# Patient Record
Sex: Male | Born: 1995 | Race: White | Hispanic: No | Marital: Single | Smoking: Former smoker
Health system: Southern US, Community
[De-identification: ages and names within clinical notes are randomized; demographics above are authoritative.]

## PROBLEM LIST (undated history)

## (undated) DIAGNOSIS — D649 Anemia, unspecified: Secondary | ICD-10-CM

## (undated) HISTORY — DX: Anemia, unspecified: D64.9

## (undated) HISTORY — PX: UPPER GASTROINTESTINAL ENDOSCOPY: SHX188

## (undated) HISTORY — PX: COLONOSCOPY: SHX174

---
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
## 2020-01-27 ENCOUNTER — Emergency Department (HOSPITAL_COMMUNITY)
Admission: EM | Admit: 2020-01-27 | Discharge: 2020-01-28 | Disposition: A | Discharge status: Home / Self Care | Payer: Self-pay | Attending: Emergency Medicine | Admitting: Emergency Medicine

## 2020-01-27 ENCOUNTER — Other Ambulatory Visit: Payer: Self-pay

## 2020-01-27 ENCOUNTER — Encounter (HOSPITAL_COMMUNITY): Payer: Self-pay | Admitting: Emergency Medicine

## 2020-01-27 DIAGNOSIS — R519 Headache, unspecified: Secondary | ICD-10-CM | POA: Insufficient documentation

## 2020-01-27 DIAGNOSIS — R5383 Other fatigue: Secondary | ICD-10-CM | POA: Insufficient documentation

## 2020-01-27 DIAGNOSIS — R109 Unspecified abdominal pain: Secondary | ICD-10-CM | POA: Insufficient documentation

## 2020-01-27 DIAGNOSIS — Z5321 Procedure and treatment not carried out due to patient leaving prior to being seen by health care provider: Secondary | ICD-10-CM | POA: Insufficient documentation

## 2020-01-27 LAB — CBC
HCT: 27.9 % — ABNORMAL LOW (ref 39.0–52.0)
Hemoglobin: 7.6 g/dL — ABNORMAL LOW (ref 13.0–17.0)
MCH: 19 pg — ABNORMAL LOW (ref 26.0–34.0)
MCHC: 27.2 g/dL — ABNORMAL LOW (ref 30.0–36.0)
MCV: 69.9 fL — ABNORMAL LOW (ref 80.0–100.0)
Platelets: 646 10*3/uL — ABNORMAL HIGH (ref 150–400)
RBC: 3.99 MIL/uL — ABNORMAL LOW (ref 4.22–5.81)
RDW: 17.4 % — ABNORMAL HIGH (ref 11.5–15.5)
WBC: 15.9 10*3/uL — ABNORMAL HIGH (ref 4.0–10.5)
nRBC: 0 % (ref 0.0–0.2)

## 2020-01-27 LAB — COMPREHENSIVE METABOLIC PANEL
ALT: 9 U/L (ref 0–44)
AST: 13 U/L — ABNORMAL LOW (ref 15–41)
Albumin: 3.5 g/dL (ref 3.5–5.0)
Alkaline Phosphatase: 47 U/L (ref 38–126)
Anion gap: 7 (ref 5–15)
BUN: <5 mg/dL — ABNORMAL LOW (ref 6–20)
CO2: 26 mmol/L (ref 22–32)
Calcium: 9.3 mg/dL (ref 8.9–10.3)
Chloride: 103 mmol/L (ref 98–111)
Creatinine, Ser: 0.89 mg/dL (ref 0.61–1.24)
GFR, Estimated: >60 mL/min (ref >60)
Glucose, Bld: 82 mg/dL (ref 70–99)
Potassium: 3.4 mmol/L — ABNORMAL LOW (ref 3.5–5.1)
Sodium: 136 mmol/L (ref 135–145)
Total Bilirubin: 0.7 mg/dL (ref 0.3–1.2)
Total Protein: 7.5 g/dL (ref 6.5–8.1)

## 2020-01-27 LAB — LIPASE, BLOOD: Lipase: 35 U/L (ref 11–51)

## 2020-01-27 NOTE — ED Notes (Signed)
Pt advised to stay but decided to leave

## 2020-01-27 NOTE — ED Triage Notes (Signed)
Pt complaining of  abd pain, increase fatigue and HA for the past week. Pt is AO x 4 no neuro deficit noticed.

## 2020-03-25 ENCOUNTER — Other Ambulatory Visit: Payer: Self-pay

## 2020-03-25 ENCOUNTER — Ambulatory Visit
Admission: RE | Admit: 2020-03-25 | Discharge: 2020-03-25 | Disposition: A | Discharge status: Home / Self Care | Payer: Self-pay | Source: Ambulatory Visit | Attending: Emergency Medicine | Admitting: Emergency Medicine

## 2020-03-25 VITALS — BP 100/65 | HR 111 | Temp 98.4°F | Resp 17

## 2020-03-25 DIAGNOSIS — K5900 Constipation, unspecified: Secondary | ICD-10-CM

## 2020-03-25 DIAGNOSIS — Z1152 Encounter for screening for COVID-19: Secondary | ICD-10-CM

## 2020-03-25 DIAGNOSIS — B349 Viral infection, unspecified: Secondary | ICD-10-CM

## 2020-03-25 MED ORDER — SENNOSIDES-DOCUSATE SODIUM 8.6-50 MG PO TABS: 1.0000 | ORAL_TABLET | Freq: Every day | ORAL | 0 refills | Status: DC

## 2020-03-25 MED ORDER — FLUTICASONE PROPIONATE 50 MCG/ACT NA SUSP: 2.0000 | Freq: Every day | NASAL | 0 refills | Status: DC

## 2020-03-25 MED ORDER — GLYCERIN (ADULT) 2 G RE SUPP: 1.0000 | Freq: Once | RECTAL | 0 refills | Status: DC | PRN

## 2020-03-25 NOTE — ED Provider Notes (Signed)
HPI  SUBJECTIVE:  Isaiah Carpenter is a 25 y.o. male who presents with 2 issues: First he reports constipation for the past 2 months.  He states that he used to stool normally once daily, but is now going "days" without defecating.  States that he drinks 500 mL to 1 L of water per day. reports painful defecation, occasional blood-streaked stools.  No melena, hematochezia, diarrhea, unintentional weight loss.  he has tried MiraLAX and unknown stools supplements with partial relief in symptoms.  No aggravating factors.  He reports mid/lower abdominal pain described as soreness and does "someone pushing on it" that is now constant as of today.  He does not take any medications on a regular basis.  No urinary retention.  He does not receive anal sex and denies foreign body insertion.  Family history negative for colon cancer.  Second, he reports 1 week of feeling feverish with fatigue, body aches, headaches, nasal congestion, rhinorrhea.  He reports mild sore throat which has resolved.  No loss of sense of smell or taste, cough, shortness of breath, nausea, vomiting.  No flu, COVID exposure.  He had a second dose of [employer] vaccine on January 15 of 2022.  He did not get the flu vaccine.  He has tried NyQuil cold and flu Tylenol with improvement in his symptoms.  No aggravating factors.  He took Tylenol within 6 hours of evaluation.  Past medical history negative for Crohn's, ulcerative colitis, constipation, HIV, abdominal surgeries, diabetes, hypertension.  PMD: None    History reviewed. No pertinent past medical history.  History reviewed. No pertinent surgical history.  History reviewed. No pertinent family history.  Social History   Tobacco Use  . Smoking status: Current Every Day Smoker    Packs/day: 0.50    Types: Cigarettes  . Smokeless tobacco: Never Used  Vaping Use  . Vaping Use: Never used  Substance Use Topics  . Alcohol use: Never  . Drug use: Never    No current  facility-administered medications for this encounter.  Current Outpatient Medications:  .  fluticasone (FLONASE) 50 MCG/ACT nasal spray, Place 2 sprays into both nostrils daily., Disp: 16 g, Rfl: 0 .  glycerin adult 2 g suppository, Place 1 suppository rectally once as needed (constipation)., Disp: 12 suppository, Rfl: 0 .  senna-docusate (SENOKOT-S) 8.6-50 MG tablet, Take 1 tablet by mouth daily., Disp: 10 tablet, Rfl: 0  Allergies  Allergen Reactions  . Ibuprofen      ROS  As noted in HPI.   Physical Exam  BP 100/65 (BP Location: Left Arm)   Pulse (!) 111   Temp 98.4 F (36.9 C) (Oral)   Resp 17   SpO2 98%   Constitutional: Well developed, well nourished, no acute distress Eyes:  EOMI, conjunctiva normal bilaterally HENT: Normocephalic, atraumatic,mucus membranes moist.  Mild nasal congestion.  No sinus tenderness. Respiratory: Normal inspiratory effort, lungs clear bilaterally. Cardiovascular:  regular tachycardia no murmurs rubs or gallop GI: Normal appearance, flat, soft.  Active bowel sounds.  Diffuse mild tenderness low abdomen.  No masses.  No guarding, rebound.  Negative McBurney. Rectal: Normal rectal tone.  Normal external appearance.  Hard stool in vault, but no impaction.  No blood on glove.  Patient declined chaperone skin: No rash, skin intact Musculoskeletal: no deformities Neurologic: Alert & oriented x 3, no focal neuro deficits Psychiatric: Speech and behavior appropriate   ED Course   Medications - No data to display  Orders Placed This Encounter  Procedures  .  Novel Coronavirus, NAA ([facility])    Standing Status:   Standing    Number of Occurrences:   1    Order Specific Question:   Patient immune status    Answer:   Normal    Order Specific Question:   Release to patient    Answer:   Immediate    No results found for this or any previous visit (from the past 24 hour(s)). No results found.  ED Clinical Impression  1. Constipation,  unspecified constipation type   2. Encounter for screening for COVID-19   3. Viral illness      ED Assessment/Plan  1.  Constipation.  No evidence of a surgical abdomen.  No evidence of obstruction, perforation, obstipation.  Glycerin suppositories, continue MiraLAX.  Senna/docusate, magnesium citrate if necessary.  Increase water to 2 L a day, increase fiber, decrease dairy.  Follow-up with GI if not getting better in several weeks.  2.  Flulike illness/Covid testing.  He is tachycardic, otherwise vitals are normal. Flu testing deferred as he is out of the window for intervention.  Home with supportive treatment.  Saline nasal irrigation, Flonase, Mucinex.  Continue Tylenol 1000 milligrams 3-4 times a day as needed.  Primary care list.  Will provide primary care referral.  Discussed labs, , MDM, treatment plan, and plan for follow-up with patient. Discussed sn/sx that should prompt return to the ED. patient agrees with plan.   Meds ordered this encounter  Medications  . fluticasone (FLONASE) 50 MCG/ACT nasal spray    Sig: Place 2 sprays into both nostrils daily.    Dispense:  16 g    Refill:  0  . DISCONTD: glycerin adult 2 g suppository    Sig: Place 1 suppository rectally once as needed (constipation).    Dispense:  12 suppository    Refill:  0  . DISCONTD: senna-docusate (SENOKOT-S) 8.6-50 MG tablet    Sig: Take 1 tablet by mouth daily.    Dispense:  10 tablet    Refill:  0  . glycerin adult 2 g suppository    Sig: Place 1 suppository rectally once as needed (constipation).    Dispense:  12 suppository    Refill:  0  . senna-docusate (SENOKOT-S) 8.6-50 MG tablet    Sig: Take 1 tablet by mouth daily.    Dispense:  10 tablet    Refill:  0    *This clinic note was created using Scientist, clinical (histocompatibility and immunogenetics). Therefore, there may be occasional mistakes despite careful proofreading.   ?    Domenick Gong, MD 03/26/20 [phone]

## 2020-03-25 NOTE — Discharge Instructions (Addendum)
Glycerin suppositories, continue MiraLAX.  Senna/docusate, magnesium citrate if necessary.  Increase water to 2 L a day, increase fiber, decrease dairy.  Follow-up with [facility] if not getting better in several weeks.   COVID will be back in several days. Saline nasal irrigation with a Lloyd Huger Med rinse and distilled water as often as you want, Flonase, Mucinex.  Continue Tylenol 1000 milligrams 3-4 times a day as needed.  Below is a list of primary care practices who are taking new patients for you to follow-up with.  [facility] internal medicine clinic Ground Floor - [facility], [address] [place], [place], [place] [address] [phone]  [facility] at [facility] [address] [place], [place] [address] [phone]  [facility] [address] [place], [place] [address] [phone]  [facility] [phone] [address] [place] [address]  [facility]: [address] [place] [address]  [phone]  [facility]: [address] [place] [address]   [phone]  [facility]: [address] [place] [address]  [phone]  [facility] : [address] [place] [address] [phone]  [facility]: [address] [place] [address] [phone]  [facility]: [address] [place] [address] [phone]  Dr. Oneal Grout [address] [facility] [place] [address]  [phone]  Dr. Jackie Plum, [facility]. [address] [place], [place] [address]  [phone]  Go to www.goodrx.com to look up your medications. This will give you a list of where you can find your prescriptions at  the most affordable prices. Or ask the pharmacist what the cash price is, or if they have any other discount programs available to help make your medication more affordable. This can be less expensive than what you would pay with insurance.

## 2020-03-25 NOTE — ED Triage Notes (Signed)
Patient states that about 2 months ago he began having issues with constipation and urgency with bowel movements. Pt states he feels like he has to go but cannot and has pressure. Pt also states he has tried otc stool softeners and has helped very little. Pt also complains of a sore throat, headache, and body aches x 1 week and has had a negative rapid covid test. Pt is aox4 and ambulatory.

## 2020-03-27 LAB — NOVEL CORONAVIRUS, NAA: SARS-CoV-2, NAA: NOT DETECTED

## 2020-03-27 LAB — SARS-COV-2, NAA 2 DAY TAT

## 2020-08-02 ENCOUNTER — Other Ambulatory Visit: Payer: Self-pay

## 2020-08-02 ENCOUNTER — Encounter (HOSPITAL_COMMUNITY): Payer: Self-pay | Admitting: *Deleted

## 2020-08-02 ENCOUNTER — Inpatient Hospital Stay (HOSPITAL_COMMUNITY)
Admission: EM | Admit: 2020-08-02 | Discharge: 2020-08-07 | DRG: 386 | Disposition: A | Discharge status: Home / Self Care | Payer: Self-pay | Attending: Internal Medicine | Admitting: Internal Medicine

## 2020-08-02 DIAGNOSIS — E871 Hypo-osmolality and hyponatremia: Secondary | ICD-10-CM | POA: Diagnosis present

## 2020-08-02 DIAGNOSIS — Z681 Body mass index (BMI) 19 or less, adult: Secondary | ICD-10-CM

## 2020-08-02 DIAGNOSIS — K51211 Ulcerative (chronic) proctitis with rectal bleeding: Principal | ICD-10-CM | POA: Diagnosis present

## 2020-08-02 DIAGNOSIS — F17210 Nicotine dependence, cigarettes, uncomplicated: Secondary | ICD-10-CM | POA: Diagnosis present

## 2020-08-02 DIAGNOSIS — K3189 Other diseases of stomach and duodenum: Secondary | ICD-10-CM | POA: Diagnosis present

## 2020-08-02 DIAGNOSIS — A630 Anogenital (venereal) warts: Secondary | ICD-10-CM | POA: Diagnosis present

## 2020-08-02 DIAGNOSIS — K8020 Calculus of gallbladder without cholecystitis without obstruction: Secondary | ICD-10-CM | POA: Diagnosis present

## 2020-08-02 DIAGNOSIS — K2980 Duodenitis without bleeding: Secondary | ICD-10-CM | POA: Diagnosis present

## 2020-08-02 DIAGNOSIS — D75839 Thrombocytosis, unspecified: Secondary | ICD-10-CM | POA: Diagnosis present

## 2020-08-02 DIAGNOSIS — K6289 Other specified diseases of anus and rectum: Secondary | ICD-10-CM | POA: Diagnosis present

## 2020-08-02 DIAGNOSIS — K2970 Gastritis, unspecified, without bleeding: Secondary | ICD-10-CM | POA: Diagnosis present

## 2020-08-02 DIAGNOSIS — K2210 Ulcer of esophagus without bleeding: Secondary | ICD-10-CM | POA: Diagnosis present

## 2020-08-02 DIAGNOSIS — E559 Vitamin D deficiency, unspecified: Secondary | ICD-10-CM | POA: Diagnosis present

## 2020-08-02 DIAGNOSIS — D500 Iron deficiency anemia secondary to blood loss (chronic): Secondary | ICD-10-CM | POA: Diagnosis present

## 2020-08-02 DIAGNOSIS — R634 Abnormal weight loss: Secondary | ICD-10-CM | POA: Diagnosis present

## 2020-08-02 DIAGNOSIS — D649 Anemia, unspecified: Secondary | ICD-10-CM

## 2020-08-02 DIAGNOSIS — E861 Hypovolemia: Secondary | ICD-10-CM | POA: Diagnosis present

## 2020-08-02 DIAGNOSIS — K641 Second degree hemorrhoids: Secondary | ICD-10-CM | POA: Diagnosis present

## 2020-08-02 DIAGNOSIS — B159 Hepatitis A without hepatic coma: Secondary | ICD-10-CM | POA: Diagnosis present

## 2020-08-02 DIAGNOSIS — K209 Esophagitis, unspecified without bleeding: Secondary | ICD-10-CM | POA: Diagnosis present

## 2020-08-02 DIAGNOSIS — Z20822 Contact with and (suspected) exposure to covid-19: Secondary | ICD-10-CM | POA: Diagnosis present

## 2020-08-02 DIAGNOSIS — K529 Noninfective gastroenteritis and colitis, unspecified: Secondary | ICD-10-CM | POA: Diagnosis present

## 2020-08-02 DIAGNOSIS — Z886 Allergy status to analgesic agent status: Secondary | ICD-10-CM

## 2020-08-02 DIAGNOSIS — K644 Residual hemorrhoidal skin tags: Secondary | ICD-10-CM | POA: Diagnosis present

## 2020-08-02 NOTE — ED Triage Notes (Signed)
The pt is c.o and pain for 2-3 days frquent urination and bms  no temp

## 2020-08-03 ENCOUNTER — Emergency Department (HOSPITAL_COMMUNITY): Payer: Self-pay

## 2020-08-03 DIAGNOSIS — D500 Iron deficiency anemia secondary to blood loss (chronic): Secondary | ICD-10-CM | POA: Diagnosis present

## 2020-08-03 DIAGNOSIS — K921 Melena: Secondary | ICD-10-CM

## 2020-08-03 DIAGNOSIS — K625 Hemorrhage of anus and rectum: Secondary | ICD-10-CM

## 2020-08-03 DIAGNOSIS — K6289 Other specified diseases of anus and rectum: Secondary | ICD-10-CM

## 2020-08-03 DIAGNOSIS — E871 Hypo-osmolality and hyponatremia: Secondary | ICD-10-CM | POA: Diagnosis present

## 2020-08-03 LAB — CBC WITH DIFFERENTIAL/PLATELET
Abs Immature Granulocytes: 0.05 10*3/uL (ref 0.00–0.07)
Basophils Absolute: 0 10*3/uL (ref 0.0–0.1)
Basophils Relative: 0 %
Eosinophils Absolute: 0 10*3/uL (ref 0.0–0.5)
Eosinophils Relative: 0 %
HCT: 27.3 % — ABNORMAL LOW (ref 39.0–52.0)
Hemoglobin: 7.5 g/dL — ABNORMAL LOW (ref 13.0–17.0)
Immature Granulocytes: 0 %
Lymphocytes Relative: 10 %
Lymphs Abs: 1.5 10*3/uL (ref 0.7–4.0)
MCH: 17.9 pg — ABNORMAL LOW (ref 26.0–34.0)
MCHC: 27.5 g/dL — ABNORMAL LOW (ref 30.0–36.0)
MCV: 65.3 fL — ABNORMAL LOW (ref 80.0–100.0)
Monocytes Absolute: 2 10*3/uL — ABNORMAL HIGH (ref 0.1–1.0)
Monocytes Relative: 13 %
Neutro Abs: 11.6 10*3/uL — ABNORMAL HIGH (ref 1.7–7.7)
Neutrophils Relative %: 77 %
Platelets: 747 10*3/uL — ABNORMAL HIGH (ref 150–400)
RBC: 4.18 MIL/uL — ABNORMAL LOW (ref 4.22–5.81)
RDW: 17.3 % — ABNORMAL HIGH (ref 11.5–15.5)
WBC: 15.2 10*3/uL — ABNORMAL HIGH (ref 4.0–10.5)
nRBC: 0 % (ref 0.0–0.2)

## 2020-08-03 LAB — URINALYSIS, ROUTINE W REFLEX MICROSCOPIC
Bilirubin Urine: NEGATIVE
Glucose, UA: NEGATIVE mg/dL
Hgb urine dipstick: NEGATIVE
Ketones, ur: 20 mg/dL — AB
Leukocytes,Ua: NEGATIVE
Nitrite: NEGATIVE
Protein, ur: NEGATIVE mg/dL
Specific Gravity, Urine: >1.046 — ABNORMAL HIGH (ref 1.005–1.030)
pH: 5 (ref 5.0–8.0)

## 2020-08-03 LAB — COMPREHENSIVE METABOLIC PANEL
ALT: 9 U/L (ref 0–44)
AST: 13 U/L — ABNORMAL LOW (ref 15–41)
Albumin: 2.7 g/dL — ABNORMAL LOW (ref 3.5–5.0)
Alkaline Phosphatase: 47 U/L (ref 38–126)
Anion gap: 8 (ref 5–15)
BUN: 8 mg/dL (ref 6–20)
CO2: 26 mmol/L (ref 22–32)
Calcium: 9.6 mg/dL (ref 8.9–10.3)
Chloride: 98 mmol/L (ref 98–111)
Creatinine, Ser: 1.02 mg/dL (ref 0.61–1.24)
GFR, Estimated: >60 mL/min (ref >60)
Glucose, Bld: 102 mg/dL — ABNORMAL HIGH (ref 70–99)
Potassium: 4.1 mmol/L (ref 3.5–5.1)
Sodium: 132 mmol/L — ABNORMAL LOW (ref 135–145)
Total Bilirubin: 0.4 mg/dL (ref 0.3–1.2)
Total Protein: 7.1 g/dL (ref 6.5–8.1)

## 2020-08-03 LAB — CBC
HCT: 26.3 % — ABNORMAL LOW (ref 39.0–52.0)
HCT: 23.4 % — ABNORMAL LOW (ref 39.0–52.0)
Hemoglobin: 7.3 g/dL — ABNORMAL LOW (ref 13.0–17.0)
Hemoglobin: 6.4 g/dL — CL (ref 13.0–17.0)
MCH: 18.1 pg — ABNORMAL LOW (ref 26.0–34.0)
MCH: 17.8 pg — ABNORMAL LOW (ref 26.0–34.0)
MCHC: 27.8 g/dL — ABNORMAL LOW (ref 30.0–36.0)
MCHC: 27.4 g/dL — ABNORMAL LOW (ref 30.0–36.0)
MCV: 65.1 fL — ABNORMAL LOW (ref 80.0–100.0)
MCV: 65.2 fL — ABNORMAL LOW (ref 80.0–100.0)
Platelets: 754 10*3/uL — ABNORMAL HIGH (ref 150–400)
Platelets: 627 10*3/uL — ABNORMAL HIGH (ref 150–400)
RBC: 4.04 MIL/uL — ABNORMAL LOW (ref 4.22–5.81)
RBC: 3.59 MIL/uL — ABNORMAL LOW (ref 4.22–5.81)
RDW: 17.4 % — ABNORMAL HIGH (ref 11.5–15.5)
RDW: 17.2 % — ABNORMAL HIGH (ref 11.5–15.5)
WBC: 13.2 10*3/uL — ABNORMAL HIGH (ref 4.0–10.5)
WBC: 12.5 10*3/uL — ABNORMAL HIGH (ref 4.0–10.5)
nRBC: 0 % (ref 0.0–0.2)
nRBC: 0 % (ref 0.0–0.2)

## 2020-08-03 LAB — IRON AND TIBC
Iron: 9 ug/dL — ABNORMAL LOW (ref 45–182)
Saturation Ratios: 2 % — ABNORMAL LOW (ref 17.9–39.5)
TIBC: 410 ug/dL (ref 250–450)
UIBC: 401 ug/dL

## 2020-08-03 LAB — SARS CORONAVIRUS 2 (TAT 6-24 HRS): SARS Coronavirus 2: NEGATIVE

## 2020-08-03 LAB — RETICULOCYTES
Immature Retic Fract: 24 % — ABNORMAL HIGH (ref 2.3–15.9)
RBC.: 4.17 MIL/uL — ABNORMAL LOW (ref 4.22–5.81)
Retic Count, Absolute: 70.5 10*3/uL (ref 19.0–186.0)
Retic Ct Pct: 1.7 % (ref 0.4–3.1)

## 2020-08-03 LAB — ABO/RH: ABO/RH(D): A POS

## 2020-08-03 LAB — POC OCCULT BLOOD, ED: Fecal Occult Bld: POSITIVE — AB

## 2020-08-03 LAB — FOLATE: Folate: 16.8 ng/mL (ref >5.9)

## 2020-08-03 LAB — PREPARE RBC (CROSSMATCH)

## 2020-08-03 LAB — FERRITIN: Ferritin: 10 ng/mL — ABNORMAL LOW (ref 24–336)

## 2020-08-03 LAB — LIPASE, BLOOD: Lipase: 18 U/L (ref 11–51)

## 2020-08-03 LAB — VITAMIN B12: Vitamin B-12: 343 pg/mL (ref 180–914)

## 2020-08-03 MED ORDER — ACETAMINOPHEN 325 MG PO TABS
650.0000 mg | ORAL_TABLET | Freq: Four times a day (QID) | ORAL | Status: DC | PRN
  Administered 2020-08-03: 650 mg via ORAL
  Filled 2020-08-03 (×2): qty 2

## 2020-08-03 MED ORDER — LIDOCAINE HCL (PF) 1 % IJ SOLN
1.0000 mL | Freq: Once | INTRAMUSCULAR | Status: AC
  Administered 2020-08-03: 1 mL
  Filled 2020-08-03: qty 5

## 2020-08-03 MED ORDER — AZITHROMYCIN 250 MG PO TABS
1000.0000 mg | ORAL_TABLET | Freq: Once | ORAL | Status: AC
  Administered 2020-08-03: 1000 mg via ORAL
  Filled 2020-08-03: qty 4

## 2020-08-03 MED ORDER — METOCLOPRAMIDE HCL 5 MG/ML IJ SOLN
10.0000 mg | Freq: Once | INTRAMUSCULAR | Status: AC
  Administered 2020-08-04: 10 mg via INTRAVENOUS
  Filled 2020-08-03: qty 2

## 2020-08-03 MED ORDER — CEFTRIAXONE SODIUM 500 MG IJ SOLR
500.0000 mg | Freq: Once | INTRAMUSCULAR | Status: AC
  Administered 2020-08-03: 15:00:00 500 mg via INTRAMUSCULAR
  Filled 2020-08-03: qty 500

## 2020-08-03 MED ORDER — SODIUM CHLORIDE 0.9% FLUSH
3.0000 mL | Freq: Two times a day (BID) | INTRAVENOUS | Status: DC
  Administered 2020-08-04 – 2020-08-07 (×4): 3 mL via INTRAVENOUS

## 2020-08-03 MED ORDER — PEG-KCL-NACL-NASULF-NA ASC-C 100 G PO SOLR
0.5000 | Freq: Once | ORAL | Status: AC
  Administered 2020-08-04: 100 g via ORAL
  Filled 2020-08-03: qty 1

## 2020-08-03 MED ORDER — PEG-KCL-NACL-NASULF-NA ASC-C 100 G PO SOLR
0.5000 | Freq: Once | ORAL | Status: AC
  Administered 2020-08-03: 100 g via ORAL
  Filled 2020-08-03: qty 1

## 2020-08-03 MED ORDER — IOHEXOL 300 MG/ML  SOLN
100.0000 mL | Freq: Once | INTRAMUSCULAR | Status: AC | PRN
  Administered 2020-08-03: 14:00:00 100 mL via INTRAVENOUS

## 2020-08-03 MED ORDER — SODIUM CHLORIDE 0.9 % IV BOLUS
1000.0000 mL | Freq: Once | INTRAVENOUS | Status: AC
  Administered 2020-08-03: 14:00:00 1000 mL via INTRAVENOUS

## 2020-08-03 MED ORDER — ACETAMINOPHEN 650 MG RE SUPP: 650.0000 mg | Freq: Four times a day (QID) | RECTAL | Status: DC | PRN

## 2020-08-03 MED ORDER — SODIUM CHLORIDE 0.9 % IV SOLN: 250.0000 mL | INTRAVENOUS | Status: DC | PRN

## 2020-08-03 MED ORDER — PEG-KCL-NACL-NASULF-NA ASC-C 100 G PO SOLR: 1.0000 | Freq: Once | ORAL | Status: DC

## 2020-08-03 MED ORDER — HYDROCODONE-ACETAMINOPHEN 5-325 MG PO TABS
1.0000 | ORAL_TABLET | ORAL | Status: DC | PRN
Start: 2020-08-03 — End: 2020-08-07
  Administered 2020-08-05: 2 via ORAL
  Filled 2020-08-03: qty 2

## 2020-08-03 MED ORDER — SODIUM CHLORIDE 0.9% FLUSH: 3.0000 mL | INTRAVENOUS | Status: DC | PRN

## 2020-08-03 MED ORDER — SODIUM CHLORIDE 0.9% IV SOLUTION: Freq: Once | INTRAVENOUS | Status: DC

## 2020-08-03 NOTE — ED Notes (Signed)
This RN noticed blood drawn at 2347 is not resulted yet. Called back into triage and blood drawn by Marylene Land. Marylene Land called the lab to notify them new samples would be coming down. [place] to tube station immediately to send blood to lab.

## 2020-08-03 NOTE — H&P (Signed)
History and Physical    Isaiah Carpenter VOJ:[id number] DOB: [birth year] DOA: 08/02/2020  PCP: Pcp, No Consultants:   Patient coming from:  Home - lives with his partner.   Chief Complaint: abdominal pain, rectal pain  HPI: Isaiah Carpenter is a 25 y.o. male with no known medical history who presented for 4 month history of intermittent lower abdominal pain that became progressively worse over the weekend.  It is located in lower abdomen with radiation into the bilateral lower quadrants. He rates the pain as 8 out of 10 and describes it as sharp. Originally was intermittent,  but over the weekend the pain become constant and unbearable. Also has pain in his rectum with BM. Some food  can make it worse.  He denies any diarrhea.  He has had intermittent blood in his stool over the past 4 days as well.  He does have a history of hemorrhoids.  He states he also started going to the bathroom every hour and only produce a small amount of stool.  He denies any history of colon cancer or inflammatory bowel disease in his family. He does not use NSAIDs.  He denies any ulcers in his mouth.  He is in a monogamous relationship with his male partner.  They were both tested for STDs in December of 2021 and negative.  Weight loss of 10 pounds since February. He has been very fatigued recently. Denies any shortness of breath. Denies any easy easy bruising or bleeding.     ED Course: vitals: bp: 110/75, hr: 102, afebrile, RR: 17 and oxygen: 100% RA. B of 7.3/hct of 26.3 and platelets of 754 stable compared to labs 6 months ago. CT showed systemic wall thickening involving the rectum extending to the level of hte mid sigmoid colon with some adjacent inflammatory stranding and prominent vasa rectal lymph nodes. GI consulted for proctitis/colitis. We were asked to admit for anemia and Gi plans for cscope tomorrow.   Review of Systems: As per HPI; otherwise review of systems reviewed and negative.   Ambulatory Status:   Ambulates without assistance  History reviewed. No pertinent past medical history.  History reviewed. No pertinent surgical history.  Social History   Socioeconomic History   Marital status: Single    Spouse name: Not on file   Number of children: Not on file   Years of education: Not on file   Highest education level: Not on file  Occupational History   Not on file  Tobacco Use   Smoking status: Every Day    Packs/day: 0.50    Pack years: 0.00    Types: Cigarettes   Smokeless tobacco: Never  Vaping Use   Vaping Use: Never used  Substance and Sexual Activity   Alcohol use: Never   Drug use: Never   Sexual activity: Yes  Other Topics Concern   Not on file  Social History Narrative   Not on file   Social Determinants of Health   Financial Resource Strain: Not on file  Food Insecurity: Not on file  Transportation Needs: Not on file  Physical Activity: Not on file  Stress: Not on file  Social Connections: Not on file  Intimate Partner Violence: Not on file    Allergies  Allergen Reactions   Ibuprofen Anaphylaxis and Swelling    No family history on file.  Prior to Admission medications   Medication Sig Start Date End Date Taking? Authorizing Provider  fluticasone (FLONASE) 50 MCG/ACT nasal spray Place 2 sprays into both  nostrils daily. 03/25/20   Domenick Gong, MD  glycerin adult 2 g suppository Place 1 suppository rectally once as needed (constipation). 03/25/20   Domenick Gong, MD  senna-docusate (SENOKOT-S) 8.6-50 MG tablet Take 1 tablet by mouth daily. 03/25/20   Domenick Gong, MD    Physical Exam: Vitals:   08/03/20 0739 08/03/20 1016 08/03/20 1141 08/03/20 1338  BP: 103/69 110/75  104/62  Pulse: 99 (!) 102  (!) 113  Resp: 15 17  16   Temp: 98.9 F (37.2 C) 99.4 F (37.4 C) 99.4 F (37.4 C)   TempSrc: Oral  Rectal   SpO2: 99% 100%  100%  Weight:      Height:         General:  Appears calm and comfortable and is in NAD. Pale appearing.   Eyes:  PERRL, EOMI, normal lids, iris ENT:  grossly normal hearing, lips & tongue, mmm; appropriate dentition Neck:  no LAD, masses or thyromegaly; no carotid bruits. Shotty adenopathy in cervical chains.  Cardiovascular:  tachycardic, no m/r/g. No LE edema.  Respiratory:   CTA bilaterally with no wheezes/rales/rhonchi.  Normal respiratory effort. Abdomen:  soft, TTP in bilateral lower quadrants. No rebound or guarding. Rectal exam deferred.  Back:   normal alignment, no CVAT Skin:  no rash or induration seen on limited exam. No obvious bruising.  Musculoskeletal:  grossly normal tone BUE/BLE, good ROM, no bony abnormality Lower extremity:  No LE edema.  Limited foot exam with no ulcerations.  2+ distal pulses. Psychiatric:  grossly normal mood and affect, speech fluent and appropriate, AOx3 Neurologic:  CN 2-12 grossly intact, moves all extremities in coordinated fashion, sensation intact    Radiological Exams on Admission: Independently reviewed - see discussion in A/P where applicable  CT ABDOMEN PELVIS W CONTRAST  Result Date: 08/03/2020 CLINICAL DATA:  Right lower quadrant abdominal pain, 2 months of constipation with intermittent urgency EXAM: CT ABDOMEN AND PELVIS WITH CONTRAST TECHNIQUE: Multidetector CT imaging of the abdomen and pelvis was performed using the standard protocol following bolus administration of intravenous contrast. CONTRAST:  08/05/2020 OMNIPAQUE IOHEXOL 300 MG/ML  SOLN COMPARISON:  None. FINDINGS: Lower chest: No acute abnormality. Normal size heart. No significant pericardial effusion/thickening. Hepatobiliary: No suspicious hepatic lesion. Cholelithiasis without findings of acute cholecystitis. No biliary ductal dilation. Pancreas: Within normal limits. Spleen: Within normal limits. Adrenals/Urinary Tract: Adrenal glands are unremarkable. Kidneys are normal, without renal calculi, focal lesion, or hydronephrosis. Bladder is unremarkable. Stomach/Bowel: Symmetric wall  thickening involving the rectum extending to the level of the mid sigmoid colon for instance on coronal image 54 and axial image 69. Large volume of formed stool throughout the remainder of the colon. The appendix is grossly unremarkable. The terminal ileum is unremarkable. No pathologic dilation of small bowel. Stomach is grossly unremarkable. Vascular/Lymphatic: No significant vascular findings are present. Prominent mesorectal lymph nodes measuring up to 5 mm on image 65/3, favored reactive. No pathologically enlarged abdominal or pelvic lymph nodes. Reproductive: Prostate is unremarkable. Other: No abdominopelvic ascites. Musculoskeletal: No acute or significant osseous findings. IMPRESSION: 1. Symmetric wall thickening involving the rectum extending to the level of the mid sigmoid colon with some adjacent inflammatory stranding and prominent mesorectal lymph nodes. Findings most consistent with infectious or inflammatory proctitis/colitis including ulcerative colitis. Gastroenterology consult suggested. 2. Large volume of formed stool throughout the remainder of the colon suggesting constipation. 3. Cholelithiasis without findings of acute cholecystitis. Electronically Signed   By: MD   On: 08/03/2020 14:02  EKG: none    Labs on Admission: I have personally reviewed the available labs and imaging studies at the time of the admission.  Pertinent labs:  Hgb: 6.4 Hct: 27.3 Platelets: 747 Sodium: 132 Fecal occult: positive  Iron studies: iron: 9 and %sat: 2 ferritin: 10   Assessment/Plan Principal Problem:   Proctitis -GI consulted and has seen. Plan for colonoscopy tomorrow. Suspicious for UC.  -given azithromycin and rocephin in ER for GC/C, but ordered urine cytology studies as well -liquid diet -prn pain medication   Active Problems:   Iron deficiency anemia due to chronic blood loss  -chronic blood loss as his hgb was 7.6 six months ago.  -hgb 6.4 in ER. Consented  for blood transfusion and giving him 2 units for goal >7.  -repeat cbc post transfusion with cbc in AM.  -thrombocytosis likely secondary to chronic anemia. Would follow outpatient after anemia is corrected.   Hyponatremia -likely secondary to volume depletion -repeat in AM following transfusion/PO liquids.   Vapes Declines any nicotine patches/gum.  Encouraged to stop.   Body mass index is 22.72 kg/m.     Level of care: Med-Surg DVT prophylaxis: scd   Code Status:  Full - confirmed with patient Family Communication: None present Disposition Plan:  The patient is from: home  Anticipated d/c is to: home pending colonoscopy procedure and findings as well as blood transfusion.  Consults called: GI consulted in ER.   Admission status:  inpatient    Orland Mustard MD [facility]   How to contact the [facility] Attending or Consulting provider 7A - 7P or covering provider during after hours 7P -7A, for this patient?  Check the care team in [facility] and look for a) attending/consulting [facility] provider listed and b) the [facility] team listed Log into [email or web address] and use [place]'s universal password to access. If you do not have the password, please contact the hospital operator. Locate the [facility] provider you are looking for under [facility] and page to a number that you can be directly reached. If you still have difficulty reaching the provider, please page the [facility] (Director on Call) for the Hospitalists listed on amion for assistance.   08/03/2020, 6:23 PM

## 2020-08-03 NOTE — ED Notes (Signed)
Report called to RN receiving pt. 

## 2020-08-03 NOTE — Consult Note (Signed)
[facility] Consult: 2:28 PM 08/03/2020  LOS: 0 days    Referring Provider: Carmie End in ED  Primary Care Physician:  Pcp, No Primary Gastroenterologist:  unassigned    Reason for Consultation:  colitis, anemia.     HPI: Isaiah Carpenter is a 25 y.o. male.  Unremarkable past history. Several months intermittent abdominal pain which has become constant since last Thursday.  Pain generally in the lower abdomen, 7/10 intensity.  Pain worse postprandial, some improvement if he lays down.  Seeing blood in his stool which he thought was from straining to have a bowel movement and hemorrhoids which he can feel himself.  Lately he is seeing more blood than he did at the beginning.  Frequently has urges to defecate and then produces only a small amount of stool.  Weight loss of 5 to 10 pounds since onset of symptoms in February.  Endorses fatigue.  The symptoms have been ebbing and flowing and he is actually had 7 to 10 days where he is symptom-free periodically.  Allergic to NSAIDs, Advil and naproxen them have caused anaphylaxis in the past so he does not use these.  Uses Tylenol infrequently if needed for pain management  Because of worsening and constant symptoms presented 11 PM last night at the ED but was not seen until 11 AM this morning.   Initial heart rate 139 and BPs as low as 97/68 and generally soft.  No fevers.  Hgb 7.3, MCV 65.1.  WBCs 13.2.  Platelets 754K. FOBT + Na 132.  Renal function and electrolytes otherwise normal.  Lipase, LFTs normal.  MEL labs are pending. CTAP w contrast: Thickening from the rectum to the mid sigmoid with adjacent inflammatory stranding and prominent mesorectal lymph nodes.  Findings consistent with infectious versus inflammatory colitis.  Large volume of formed stool in the  remaining colon suggesting constipation.  Uncomplicated cholelithiasis.  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  Moderate alcohol consisting of 1-2 drinks once a month.  Vapes daily.  No Fm Hx IBD, nor of autoimmune disorders.  History reviewed. No pertinent past medical history.  History reviewed. No pertinent surgical history.  Prior to Admission medications   Medication Sig Start Date End Date Taking? Authorizing Provider  fluticasone (FLONASE) 50 MCG/ACT nasal spray Place 2 sprays into both nostrils daily. 03/25/20   Melynda Ripple, MD  glycerin adult 2 g suppository Place 1 suppository rectally once as needed (constipation). 03/25/20   Melynda Ripple, MD  senna-docusate (SENOKOT-S) 8.6-50 MG tablet Take 1 tablet by mouth daily. 03/25/20   Melynda Ripple, MD    Scheduled Meds:  azithromycin  1,000 mg Oral Once   cefTRIAXone (ROCEPHIN) IM  500 mg Intramuscular Once   lidocaine (PF)  1 mL Other Once   Infusions:  PRN Meds:    Allergies as of 08/02/2020 - Review Complete 08/02/2020  Allergen Reaction Noted   Ibuprofen  01/27/2020    No family history on file.  Social History   Socioeconomic History   Marital status: Single    Spouse  name: Not on file   Number of children: Not on file   Years of education: Not on file   Highest education level: Not on file  Occupational History   Not on file  Tobacco Use   Smoking status: Every Day    Packs/day: 0.50    Pack years: 0.00    Types: Cigarettes   Smokeless tobacco: Never  Vaping Use   Vaping Use: Never used  Substance and Sexual Activity   Alcohol use: Never   Drug use: Never   Sexual activity: Yes  Other Topics Concern   Not on file  Social History Narrative   Not on file   Social Determinants of Health   Financial Resource Strain: Not on file  Food Insecurity: Not on file  Transportation Needs: Not on file  Physical Activity: Not on file  Stress: Not on file  Social Connections: Not on  file  Intimate Partner Violence: Not on file    REVIEW OF SYSTEMS: Constitutional: Weakness, fatigue. ENT:  No nose bleeds Pulm: Occasional dyspnea on exertion.  No cough.  No resting dyspnea. CV:  No palpitations, no LE edema.  No angina GU:  No hematuria, no frequency GI: See HPI. Heme: Other than the rectal bleeding, no other unusual or excessive bleeding or bruising.  No previous history of anemia. Transfusions: No previous blood product transfusions. Neuro:  No headaches, no peripheral tingling or numbness Derm:  No itching, no rash or sores.  Endocrine:  No sweats or chills.  No polyuria or dysuria Immunization: Not queried Travel:  None beyond local counties in last few months.    PHYSICAL EXAM: Vital signs in last 24 hours: Vitals:   08/03/20 1141 08/03/20 1338  BP:  104/62  Pulse:  (!) 113  Resp:  16  Temp: 99.4 F (37.4 C)   SpO2:  100%   Wt Readings from Last 3 Encounters:  08/02/20 65.8 kg  01/27/20 65.8 kg    General: Thin, pale, comfortable, does not appear acutely ill.  Alert and able to provide good history Head: No facial asymmetry or swelling.  No signs of head trauma. Eyes: Conjunctiva pale.  EOMI Ears: Not hard of hearing Nose: No congestion or discharge Mouth: Good dentition.  Tongue midline.  Mucosa pink, moist, clear. Neck: No JVD, no masses, no thyromegaly Lungs: Clear bilaterally without labored breathing. Heart: RRR.  No MRG.  S1, S2 present Abdomen: Soft.  Tenderness in the lower abdomen without guarding or rebound.  Not distended.  Bowel sounds active without high-pitched or tinkling bowel sounds.  No HSM, masses, bruits, hernias.   Rectal: Did not perform DRE.  Reported by patient as bloody. Musc/Skeltl: No joint redness, swelling or gross deformity. Extremities: No CCE Neurologic: Alert.  Oriented x3.  Moves all 4 limbs without tremor or weakness. Skin: Pale.  No suspicious lesions or rashes. Tattoos: Several tattoos on the trunk and  limbs, all professional quality. Nodes: No cervical adenopathy Psych: Cooperative, calm, pleasant.  Fluid speech.  Intake/Output from previous day: No intake/output data recorded. Intake/Output this shift: No intake/output data recorded.  LAB RESULTS: Recent Labs    08/03/20 1013 08/03/20 1418  WBC 13.2* 15.2*  HGB 7.3* 7.5*  HCT 26.3* 27.3*  PLT 754* 747*   BMET Lab Results  Component Value Date   NA 132 (L) 08/03/2020   NA 136 01/27/2020   K 4.1 08/03/2020   K 3.4 (L) 01/27/2020   CL 98 08/03/2020   CL 103 01/27/2020  CO2 26 08/03/2020   CO2 26 01/27/2020   GLUCOSE 102 (H) 08/03/2020   GLUCOSE 82 01/27/2020   BUN 8 08/03/2020   BUN <5 (L) 01/27/2020   CREATININE 1.02 08/03/2020   CREATININE 0.89 01/27/2020   CALCIUM 9.6 08/03/2020   CALCIUM 9.3 01/27/2020   LFT Recent Labs    08/03/20 1013  PROT 7.1  ALBUMIN 2.7*  AST 13*  ALT 9  ALKPHOS 47  BILITOT 0.4   PT/INR No results found for: INR, PROTIME Hepatitis Panel No results for input(s): HEPBSAG, HCVAB, HEPAIGM, HEPBIGM in the last 72 hours. C-Diff No components found for: CDIFF Lipase     Component Value Date/Time   LIPASE 18 08/03/2020 1013    Drugs of Abuse  No results found for: LABOPIA, COCAINSCRNUR, LABBENZ, AMPHETMU, THCU, LABBARB   RADIOLOGY STUDIES: CT ABDOMEN PELVIS W CONTRAST  Result Date: 08/03/2020 CLINICAL DATA:  Right lower quadrant abdominal pain, 2 months of constipation with intermittent urgency EXAM: CT ABDOMEN AND PELVIS WITH CONTRAST TECHNIQUE: Multidetector CT imaging of the abdomen and pelvis was performed using the standard protocol following bolus administration of intravenous contrast. CONTRAST:  170m OMNIPAQUE IOHEXOL 300 MG/ML  SOLN COMPARISON:  None. FINDINGS: Lower chest: No acute abnormality. Normal size heart. No significant pericardial effusion/thickening. Hepatobiliary: No suspicious hepatic lesion. Cholelithiasis without findings of acute cholecystitis. No  biliary ductal dilation. Pancreas: Within normal limits. Spleen: Within normal limits. Adrenals/Urinary Tract: Adrenal glands are unremarkable. Kidneys are normal, without renal calculi, focal lesion, or hydronephrosis. Bladder is unremarkable. Stomach/Bowel: Symmetric wall thickening involving the rectum extending to the level of the mid sigmoid colon for instance on coronal image 54 and axial image 69. Large volume of formed stool throughout the remainder of the colon. The appendix is grossly unremarkable. The terminal ileum is unremarkable. No pathologic dilation of small bowel. Stomach is grossly unremarkable. Vascular/Lymphatic: No significant vascular findings are present. Prominent mesorectal lymph nodes measuring up to 5 mm on image 65/3, favored reactive. No pathologically enlarged abdominal or pelvic lymph nodes. Reproductive: Prostate is unremarkable. Other: No abdominopelvic ascites. Musculoskeletal: No acute or significant osseous findings. IMPRESSION: 1. Symmetric wall thickening involving the rectum extending to the level of the mid sigmoid colon with some adjacent inflammatory stranding and prominent mesorectal lymph nodes. Findings most consistent with infectious or inflammatory proctitis/colitis including ulcerative colitis. Gastroenterology consult suggested. 2. Large volume of formed stool throughout the remainder of the colon suggesting constipation. 3. Cholelithiasis without findings of acute cholecystitis. Electronically Signed   By: JDahlia BailiffMD   On: 08/03/2020 14:02     IMPRESSION:      Colitis involving sigmoid to rectum..Marland Kitchen By the history and CT findings suspicious for ulcerative colitis.  Microcytic anemia in setting of bloody stools.     Uncomplicated cholelithiasis.    PLAN:       Colonoscopy, set for 230 tomorrow with moderate sedation though anesthesia may be able to provide MAC for sedation. Patient agreeable to proceed with the colonoscopy.  He was hoping it  could be done as an outpatient but I explained that it could be several weeks before we can fit him in for outpatient procedure and that if he stays overnight and we do the procedure tomorrow he will have his diagnosis much sooner and we can treat him appropriately.       Hgb/crit in AM.       ?  Initiate IV Solu-Medrol?, will d/w Dr MJerilynn Mages      Clear  liquids.     Azucena Freed  08/03/2020, 2:28 PM Phone [phone]

## 2020-08-03 NOTE — ED Provider Notes (Signed)
[facility]ENC[facility] DEPARTMENT Provider Note   CSN: [id number] Arrival date & time: 08/02/20  2200     History Chief Complaint  Patient presents with   Abdominal Pain    Isaiah Carpenter is a 25 y.o. male with no reported medical history.  Patient presents emerged department with a chief complaint of lower abdominal pain.  Patient reports that he has had this pain intermittently since February.  Patient reports that his pain has been constant since Thursday.  Pain is located throughout the entire lower abdomen.  Patient rates pain 7/10 on the pain scale.  Patient reports that pain is worse when eating.  Minimal improvement with laying down.  Patient endorses blood in stool.  States that he has been straining during bowel movements and believes he has hemorrhoids.  Patient describes blood as bright red in notices it when wiping and minimal amount in toilet bowl after bowel movement.  Patient reports frequent feelings of needing to have a bowel movement and then only producing small amounts of stool.  Patient endorses chills, nausea, night sweats, unintentional weight loss of 5 to 10 pounds since February, fatigue.  No change in stool caliber.  Patient denies any melena, vomiting, chest pain, shortness of breath, URI symptoms, dysuria, hematuria, genital sores or lesions, penile discharge, swelling or tenderness to genitals.  Patient reports that he has not been sexually active for a few months.  Patient does not perform receptive anal sex.   Abdominal Pain Associated symptoms: chills, constipation and fatigue   Associated symptoms: no chest pain, no cough, no diarrhea, no dysuria, no fever, no hematuria, no nausea, no shortness of breath, no sore throat and no vomiting       History reviewed. No pertinent past medical history.  There are no problems to display for this patient.   History reviewed. No pertinent surgical history.     No family history on  file.  Social History   Tobacco Use   Smoking status: Every Day    Packs/day: 0.50    Pack years: 0.00    Types: Cigarettes   Smokeless tobacco: Never  Vaping Use   Vaping Use: Never used  Substance Use Topics   Alcohol use: Never   Drug use: Never    Home Medications Prior to Admission medications   Medication Sig Start Date End Date Taking? Authorizing Provider  fluticasone (FLONASE) 50 MCG/ACT nasal spray Place 2 sprays into both nostrils daily. 03/25/20   Melynda Ripple, MD  glycerin adult 2 g suppository Place 1 suppository rectally once as needed (constipation). 03/25/20   Melynda Ripple, MD  senna-docusate (SENOKOT-S) 8.6-50 MG tablet Take 1 tablet by mouth daily. 03/25/20   Melynda Ripple, MD    Allergies    Ibuprofen  Review of Systems   Review of Systems  Constitutional:  Positive for chills, fatigue and unexpected weight change. Negative for fever.  HENT:  Negative for congestion, rhinorrhea and sore throat.   Eyes:  Negative for visual disturbance.  Respiratory:  Negative for cough and shortness of breath.   Cardiovascular:  Negative for chest pain.  Gastrointestinal:  Positive for abdominal pain, blood in stool, constipation and rectal pain. Negative for abdominal distention, anal bleeding, diarrhea, nausea and vomiting.  Genitourinary:  Negative for difficulty urinating, dysuria, genital sores, hematuria, penile discharge, penile pain, penile swelling, scrotal swelling and testicular pain.  Musculoskeletal:  Negative for back pain and neck pain.  Skin:  Negative for color change, pallor, rash and  wound.  Neurological:  Negative for dizziness, syncope, light-headedness and headaches.  Psychiatric/Behavioral:  Negative for confusion.    Physical Exam Updated Vital Signs BP 110/75 (BP Location: Left Arm)   Pulse (!) 102   Temp 99.4 F (37.4 C)   Resp 17   Ht _0  (1.702 m)   Wt 65.8 kg   SpO2 100%   BMI 22.72 kg/m   Physical Exam Vitals and  nursing note reviewed. Exam conducted with a chaperone present (Male RN present as chaperone).  Constitutional:      General: He is not in acute distress.    Appearance: He is not ill-appearing, toxic-appearing or diaphoretic.  HENT:     Head: Normocephalic.  Eyes:     General: No scleral icterus.       Right eye: No discharge.        Left eye: No discharge.  Cardiovascular:     Rate and Rhythm: Tachycardia present.     Comments: Tachycardia at rate of 102 Pulmonary:     Effort: Pulmonary effort is normal. No tachypnea, bradypnea or respiratory distress.     Breath sounds: Normal breath sounds. No stridor.  Abdominal:     General: Abdomen is flat. Bowel sounds are normal. There is no distension. There are no signs of injury.     Palpations: Abdomen is soft. There is no mass or pulsatile mass.     Tenderness: There is abdominal tenderness in the right lower quadrant and left lower quadrant. There is right CVA tenderness. There is no left CVA tenderness, guarding or rebound. Positive signs include McBurney's sign. Negative signs include Murphy's sign and psoas sign.     Hernia: There is no hernia in the umbilical area or ventral area.  Genitourinary:    Rectum: Guaiac result positive. Tenderness and external hemorrhoid present. No mass, anal fissure or internal hemorrhoid. Normal anal tone.     Comments: Patient had external hemorrhoids to 6, 9, and 12 o'clock position.  Tenderness to rectum.  Small amount of light brown stool noted in rectal vault, no melena or frank red blood observed. Musculoskeletal:     Cervical back: Neck supple.  Skin:    General: Skin is warm and dry.  Neurological:     General: No focal deficit present.     Mental Status: He is alert.  Psychiatric:        Behavior: Behavior is cooperative.    ED Results / Procedures / Treatments   Labs (all labs ordered are listed, but only abnormal results are displayed) Labs Reviewed  COMPREHENSIVE METABOLIC PANEL -  Abnormal; Notable for the following components:      Result Value   Sodium 132 (*)    Glucose, Bld 102 (*)    Albumin 2.7 (*)    AST 13 (*)    All other components within normal limits  CBC - Abnormal; Notable for the following components:   WBC 13.2 (*)    RBC 4.04 (*)    Hemoglobin 7.3 (*)    HCT 26.3 (*)    MCV 65.1 (*)    MCH 18.1 (*)    MCHC 27.8 (*)    RDW 17.4 (*)    Platelets 754 (*)    All other components within normal limits  POC OCCULT BLOOD, ED - Abnormal; Notable for the following components:   Fecal Occult Bld POSITIVE (*)    All other components within normal limits  LIPASE, BLOOD  URINALYSIS, ROUTINE W REFLEX  MICROSCOPIC  DIFFERENTIAL  VITAMIN B12  FOLATE  IRON AND TIBC  FERRITIN  RETICULOCYTES  TYPE AND SCREEN    EKG None  Radiology CT ABDOMEN PELVIS W CONTRAST  Result Date: 08/03/2020 CLINICAL DATA:  Right lower quadrant abdominal pain, 2 months of constipation with intermittent urgency EXAM: CT ABDOMEN AND PELVIS WITH CONTRAST TECHNIQUE: Multidetector CT imaging of the abdomen and pelvis was performed using the standard protocol following bolus administration of intravenous contrast. CONTRAST:  13m OMNIPAQUE IOHEXOL 300 MG/ML  SOLN COMPARISON:  None. FINDINGS: Lower chest: No acute abnormality. Normal size heart. No significant pericardial effusion/thickening. Hepatobiliary: No suspicious hepatic lesion. Cholelithiasis without findings of acute cholecystitis. No biliary ductal dilation. Pancreas: Within normal limits. Spleen: Within normal limits. Adrenals/Urinary Tract: Adrenal glands are unremarkable. Kidneys are normal, without renal calculi, focal lesion, or hydronephrosis. Bladder is unremarkable. Stomach/Bowel: Symmetric wall thickening involving the rectum extending to the level of the mid sigmoid colon for instance on coronal image 54 and axial image 69. Large volume of formed stool throughout the remainder of the colon. The appendix is grossly  unremarkable. The terminal ileum is unremarkable. No pathologic dilation of small bowel. Stomach is grossly unremarkable. Vascular/Lymphatic: No significant vascular findings are present. Prominent mesorectal lymph nodes measuring up to 5 mm on image 65/3, favored reactive. No pathologically enlarged abdominal or pelvic lymph nodes. Reproductive: Prostate is unremarkable. Other: No abdominopelvic ascites. Musculoskeletal: No acute or significant osseous findings. IMPRESSION: 1. Symmetric wall thickening involving the rectum extending to the level of the mid sigmoid colon with some adjacent inflammatory stranding and prominent mesorectal lymph nodes. Findings most consistent with infectious or inflammatory proctitis/colitis including ulcerative colitis. Gastroenterology consult suggested. 2. Large volume of formed stool throughout the remainder of the colon suggesting constipation. 3. Cholelithiasis without findings of acute cholecystitis. Electronically Signed   By: JDahlia BailiffMD   On: 08/03/2020 14:02    Procedures Procedures   Medications Ordered in ED Medications  peg 3350 powder (MOVIPREP) kit 200 g (has no administration in time range)  metoCLOPramide (REGLAN) injection 10 mg (has no administration in time range)    Followed by  metoCLOPramide (REGLAN) injection 10 mg (has no administration in time range)  iohexol (OMNIPAQUE) 300 MG/ML solution 100 mL (100 mLs Intravenous Contrast Given 08/03/20 1332)  sodium chloride 0.9 % bolus 1,000 mL (1,000 mLs Intravenous New Bag/Given 08/03/20 1426)  cefTRIAXone (ROCEPHIN) injection 500 mg (500 mg Intramuscular Given 08/03/20 1439)  lidocaine (PF) (XYLOCAINE) 1 % injection 1 mL (1 mL Other Given 08/03/20 1439)  azithromycin (ZITHROMAX) tablet 1,000 mg (1,000 mg Oral Given 08/03/20 1438)    ED Course  I have reviewed the triage vital signs and the nursing notes.  Pertinent labs & imaging results that were available during my care of the patient were  reviewed by me and considered in my medical decision making (see chart for details).  Clinical Course as of 08/03/20 1534  Mon Aug 03, 2020  1429 Spoke 2 on-call gastroenterologist to reports they will come to see the patient. [PB]    Clinical Course User Index [PB] BDyann Ruddle  MDM Rules/Calculators/A&P                          Alert 25year old male no acute distress, nontoxic-appearing.  Patient presents emerged part with a chief complaint of lower abdominal pain.  Patient symptoms have been intermittent since February.  Pain has been constant since Thursday.  Patient endorses blood in stool, which she described as bright red and seen with wiping and in bowl.  No melena or change in stool caliber. Patient reports frequent feelings of needing to have a bowel movement and then only producing small amounts of stool.  Patient endorses chills, nausea, night sweats, unintentional weight loss of 5 to 10 pounds since February, fatigue.  No change in stool caliber.  Physical exam patient has tenderness to lower left and right quadrant.  Abdomen soft, nondistended, no guarding or rebound tenderness.  Rectal exam shows multiple hemorrhoids, rectal tenderness.  Small amount of light brown stool noted in rectal vault, no frank red blood or melena.  Hemoccult positive.  Rectal temperature 99.4.  CBC shows leukocytosis at 13.2, anemia with hemoglobin at 7.3 and hematocrit at 26.3, thrombocytosis at 754.  Lab results are stable compared with last known labs obtained 6 months prior.  Patient denies any chest pain, shortness of breath.  Patient does endorse fatigue. Will obtain differential to evaluate for blasts or abnormal cell morphology.  Will obtain anemia panel. Differential obtained shows increased neutrophil and monocyte count.  We will consult hematology for input on additional testing to evaluate for possible malignancy.  CMP shows albumin decreased at 2.7 Lipase within normal  limits. UA shows no signs of infection.  Due to patient's lower quadrant abdominal pain will obtain CT scan of abdomen pelvis to evaluate for possible appendicitis, diverticulitis, or intra-abdominal mass. CT abdomen pelvis shows systemic wall thickening involving the rectum extending to the level of the mid sigmoid colon with some adjacent inflammatory stranding and prominent vasa rectal lymph nodes.  Findings most consistent with infectious or inflammatory proctitis/colitis including UC.  We will treat patient carefully for gonorrhea chlamydia based on age and sexual history.  Patient given ceftriaxone and azithromycin. Consulted gastroenterology due to patient's CT scan findings.   1429 spoke to on-call GI PA Azucena Freed who advised GI team will see the patient.  Per gastroenterology note patient will be taken to colonoscopy tomorrow.  We will consult hospitalist for admission.  Patient care transferred to [place] at the end of my shift. Patient presentation, ED course, and plan of care discussed with review of all pertinent labs and imaging. Please see his/her note for further details regarding further ED course and disposition.   Final Clinical Impression(s) / ED Diagnoses Final diagnoses:  Colitis  Anemia, unspecified type    Rx / DC Orders ED Discharge Orders     None        Dyann Ruddle 08/03/20 1549    Noemi Chapel, MD 08/03/20 1708

## 2020-08-03 NOTE — H&P (View-Only) (Signed)
[facility] Consult: 2:28 PM 08/03/2020  LOS: 0 days    Referring Provider: Carmie End in ED  Primary Care Physician:  Pcp, No Primary Gastroenterologist:  unassigned    Reason for Consultation:  colitis, anemia.     HPI: Isaiah Carpenter is a 25 y.o. male.  Unremarkable past history. Several months intermittent abdominal pain which has become constant since last Thursday.  Pain generally in the lower abdomen, 7/10 intensity.  Pain worse postprandial, some improvement if he lays down.  Seeing blood in his stool which he thought was from straining to have a bowel movement and hemorrhoids which he can feel himself.  Lately he is seeing more blood than he did at the beginning.  Frequently has urges to defecate and then produces only a small amount of stool.  Weight loss of 5 to 10 pounds since onset of symptoms in February.  Endorses fatigue.  The symptoms have been ebbing and flowing and he is actually had 7 to 10 days where he is symptom-free periodically.  Allergic to NSAIDs, Advil and naproxen them have caused anaphylaxis in the past so he does not use these.  Uses Tylenol infrequently if needed for pain management  Because of worsening and constant symptoms presented 11 PM last night at the ED but was not seen until 11 AM this morning.   Initial heart rate 139 and BPs as low as 97/68 and generally soft.  No fevers.  Hgb 7.3, MCV 65.1.  WBCs 13.2.  Platelets 754K. FOBT + Na 132.  Renal function and electrolytes otherwise normal.  Lipase, LFTs normal.  MEL labs are pending. CTAP w contrast: Thickening from the rectum to the mid sigmoid with adjacent inflammatory stranding and prominent mesorectal lymph nodes.  Findings consistent with infectious versus inflammatory colitis.  Large volume of formed stool in the  remaining colon suggesting constipation.  Uncomplicated cholelithiasis.  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  Moderate alcohol consisting of 1-2 drinks once a month.  Vapes daily.  No Fm Hx IBD, nor of autoimmune disorders.  History reviewed. No pertinent past medical history.  History reviewed. No pertinent surgical history.  Prior to Admission medications   Medication Sig Start Date End Date Taking? Authorizing Provider  fluticasone (FLONASE) 50 MCG/ACT nasal spray Place 2 sprays into both nostrils daily. 03/25/20   Melynda Ripple, MD  glycerin adult 2 g suppository Place 1 suppository rectally once as needed (constipation). 03/25/20   Melynda Ripple, MD  senna-docusate (SENOKOT-S) 8.6-50 MG tablet Take 1 tablet by mouth daily. 03/25/20   Melynda Ripple, MD    Scheduled Meds:  azithromycin  1,000 mg Oral Once   cefTRIAXone (ROCEPHIN) IM  500 mg Intramuscular Once   lidocaine (PF)  1 mL Other Once   Infusions:  PRN Meds:    Allergies as of 08/02/2020 - Review Complete 08/02/2020  Allergen Reaction Noted   Ibuprofen  01/27/2020    No family history on file.  Social History   Socioeconomic History   Marital status: Single    Spouse  name: Not on file   Number of children: Not on file   Years of education: Not on file   Highest education level: Not on file  Occupational History   Not on file  Tobacco Use   Smoking status: Every Day    Packs/day: 0.50    Pack years: 0.00    Types: Cigarettes   Smokeless tobacco: Never  Vaping Use   Vaping Use: Never used  Substance and Sexual Activity   Alcohol use: Never   Drug use: Never   Sexual activity: Yes  Other Topics Concern   Not on file  Social History Narrative   Not on file   Social Determinants of Health   Financial Resource Strain: Not on file  Food Insecurity: Not on file  Transportation Needs: Not on file  Physical Activity: Not on file  Stress: Not on file  Social Connections: Not on  file  Intimate Partner Violence: Not on file    REVIEW OF SYSTEMS: Constitutional: Weakness, fatigue. ENT:  No nose bleeds Pulm: Occasional dyspnea on exertion.  No cough.  No resting dyspnea. CV:  No palpitations, no LE edema.  No angina GU:  No hematuria, no frequency GI: See HPI. Heme: Other than the rectal bleeding, no other unusual or excessive bleeding or bruising.  No previous history of anemia. Transfusions: No previous blood product transfusions. Neuro:  No headaches, no peripheral tingling or numbness Derm:  No itching, no rash or sores.  Endocrine:  No sweats or chills.  No polyuria or dysuria Immunization: Not queried Travel:  None beyond local counties in last few months.    PHYSICAL EXAM: Vital signs in last 24 hours: Vitals:   08/03/20 1141 08/03/20 1338  BP:  104/62  Pulse:  (!) 113  Resp:  16  Temp: 99.4 F (37.4 C)   SpO2:  100%   Wt Readings from Last 3 Encounters:  08/02/20 65.8 kg  01/27/20 65.8 kg    General: Thin, pale, comfortable, does not appear acutely ill.  Alert and able to provide good history Head: No facial asymmetry or swelling.  No signs of head trauma. Eyes: Conjunctiva pale.  EOMI Ears: Not hard of hearing Nose: No congestion or discharge Mouth: Good dentition.  Tongue midline.  Mucosa pink, moist, clear. Neck: No JVD, no masses, no thyromegaly Lungs: Clear bilaterally without labored breathing. Heart: RRR.  No MRG.  S1, S2 present Abdomen: Soft.  Tenderness in the lower abdomen without guarding or rebound.  Not distended.  Bowel sounds active without high-pitched or tinkling bowel sounds.  No HSM, masses, bruits, hernias.   Rectal: Did not perform DRE.  Reported by patient as bloody. Musc/Skeltl: No joint redness, swelling or gross deformity. Extremities: No CCE Neurologic: Alert.  Oriented x3.  Moves all 4 limbs without tremor or weakness. Skin: Pale.  No suspicious lesions or rashes. Tattoos: Several tattoos on the trunk and  limbs, all professional quality. Nodes: No cervical adenopathy Psych: Cooperative, calm, pleasant.  Fluid speech.  Intake/Output from previous day: No intake/output data recorded. Intake/Output this shift: No intake/output data recorded.  LAB RESULTS: Recent Labs    08/03/20 1013 08/03/20 1418  WBC 13.2* 15.2*  HGB 7.3* 7.5*  HCT 26.3* 27.3*  PLT 754* 747*   BMET Lab Results  Component Value Date   NA 132 (L) 08/03/2020   NA 136 01/27/2020   K 4.1 08/03/2020   K 3.4 (L) 01/27/2020   CL 98 08/03/2020   CL 103 01/27/2020  CO2 26 08/03/2020   CO2 26 01/27/2020   GLUCOSE 102 (H) 08/03/2020   GLUCOSE 82 01/27/2020   BUN 8 08/03/2020   BUN <5 (L) 01/27/2020   CREATININE 1.02 08/03/2020   CREATININE 0.89 01/27/2020   CALCIUM 9.6 08/03/2020   CALCIUM 9.3 01/27/2020   LFT Recent Labs    08/03/20 1013  PROT 7.1  ALBUMIN 2.7*  AST 13*  ALT 9  ALKPHOS 47  BILITOT 0.4   PT/INR No results found for: INR, PROTIME Hepatitis Panel No results for input(s): HEPBSAG, HCVAB, HEPAIGM, HEPBIGM in the last 72 hours. C-Diff No components found for: CDIFF Lipase     Component Value Date/Time   LIPASE 18 08/03/2020 1013    Drugs of Abuse  No results found for: LABOPIA, COCAINSCRNUR, LABBENZ, AMPHETMU, THCU, LABBARB   RADIOLOGY STUDIES: CT ABDOMEN PELVIS W CONTRAST  Result Date: 08/03/2020 CLINICAL DATA:  Right lower quadrant abdominal pain, 2 months of constipation with intermittent urgency EXAM: CT ABDOMEN AND PELVIS WITH CONTRAST TECHNIQUE: Multidetector CT imaging of the abdomen and pelvis was performed using the standard protocol following bolus administration of intravenous contrast. CONTRAST:  170m OMNIPAQUE IOHEXOL 300 MG/ML  SOLN COMPARISON:  None. FINDINGS: Lower chest: No acute abnormality. Normal size heart. No significant pericardial effusion/thickening. Hepatobiliary: No suspicious hepatic lesion. Cholelithiasis without findings of acute cholecystitis. No  biliary ductal dilation. Pancreas: Within normal limits. Spleen: Within normal limits. Adrenals/Urinary Tract: Adrenal glands are unremarkable. Kidneys are normal, without renal calculi, focal lesion, or hydronephrosis. Bladder is unremarkable. Stomach/Bowel: Symmetric wall thickening involving the rectum extending to the level of the mid sigmoid colon for instance on coronal image 54 and axial image 69. Large volume of formed stool throughout the remainder of the colon. The appendix is grossly unremarkable. The terminal ileum is unremarkable. No pathologic dilation of small bowel. Stomach is grossly unremarkable. Vascular/Lymphatic: No significant vascular findings are present. Prominent mesorectal lymph nodes measuring up to 5 mm on image 65/3, favored reactive. No pathologically enlarged abdominal or pelvic lymph nodes. Reproductive: Prostate is unremarkable. Other: No abdominopelvic ascites. Musculoskeletal: No acute or significant osseous findings. IMPRESSION: 1. Symmetric wall thickening involving the rectum extending to the level of the mid sigmoid colon with some adjacent inflammatory stranding and prominent mesorectal lymph nodes. Findings most consistent with infectious or inflammatory proctitis/colitis including ulcerative colitis. Gastroenterology consult suggested. 2. Large volume of formed stool throughout the remainder of the colon suggesting constipation. 3. Cholelithiasis without findings of acute cholecystitis. Electronically Signed   By: JDahlia BailiffMD   On: 08/03/2020 14:02     IMPRESSION:      Colitis involving sigmoid to rectum..Marland Kitchen By the history and CT findings suspicious for ulcerative colitis.  Microcytic anemia in setting of bloody stools.     Uncomplicated cholelithiasis.    PLAN:       Colonoscopy, set for 230 tomorrow with moderate sedation though anesthesia may be able to provide MAC for sedation. Patient agreeable to proceed with the colonoscopy.  He was hoping it  could be done as an outpatient but I explained that it could be several weeks before we can fit him in for outpatient procedure and that if he stays overnight and we do the procedure tomorrow he will have his diagnosis much sooner and we can treat him appropriately.       Hgb/crit in AM.       ?  Initiate IV Solu-Medrol?, will d/w Dr MJerilynn Mages      Clear  liquids.     Azucena Freed  08/03/2020, 2:28 PM Phone [phone]

## 2020-08-03 NOTE — ED Notes (Signed)
Clear liquids eaten. Pt moved out of h/w. No changes.

## 2020-08-03 NOTE — ED Notes (Signed)
PT in CT.

## 2020-08-03 NOTE — ED Notes (Addendum)
Alert, NAD, calm, interactive, pale, resps e/u, ordered clear fluid tray, given broth, and apple juice. Antibody screen corrected to negative per Blood Bank. Covid swab and repeat CBC sent.

## 2020-08-03 NOTE — Progress Notes (Signed)
New Admission Note:  Arrival Method:stretcher from ER Mental Orientation: A & O x4 Telemetry:n/a Assessment: Completed Skin: intact IV: L AC/blood transfusing Pain:0/10 Safety Measures: Safety Fall Prevention Plan was given, discussed. 5M19: Patient has been orientated to the room, unit and the staff. Family: pt updated mother   Orders have been reviewed and implemented. Will continue to monitor the patient. Call light has been placed within reach and bed alarm has been activated.   Lawernce Ion ,RN

## 2020-08-03 NOTE — ED Notes (Signed)
Critical notification reported to team: GI & EDP attending aware: hgb dropped to 6.4, checked after 1L of NS. ambulatory with steady gait. HR 113. just went to b/r reports scant blood with scant BM. Per GI: Plan for procedure(s) tomorrow to further evaluate.  Reasonable to consider by primary team to give pRBC + IV Iron infusion overnight.  Make sure Hgb is >7 by tomorrow AM. Pending hospitalist consult.

## 2020-08-04 ENCOUNTER — Encounter (HOSPITAL_COMMUNITY): Payer: Self-pay | Admitting: Certified Registered Nurse Anesthetist

## 2020-08-04 ENCOUNTER — Encounter (HOSPITAL_COMMUNITY)
Admission: EM | Disposition: A | Discharge status: Home / Self Care | Payer: Self-pay | Source: Home / Self Care | Attending: Internal Medicine

## 2020-08-04 ENCOUNTER — Encounter (HOSPITAL_COMMUNITY): Payer: Self-pay | Admitting: Family Medicine

## 2020-08-04 HISTORY — PX: BIOPSY: SHX5522

## 2020-08-04 HISTORY — PX: COLONOSCOPY WITH PROPOFOL: SHX5780

## 2020-08-04 HISTORY — PX: ESOPHAGOGASTRODUODENOSCOPY: SHX5428

## 2020-08-04 LAB — BPAM RBC
Blood Product Expiration Date: 202207022359
Blood Product Expiration Date: 202207032359
ISSUE DATE / TIME: 202206131929
ISSUE DATE / TIME: 202206132307
Unit Type and Rh: 6200
Unit Type and Rh: 6200

## 2020-08-04 LAB — TYPE AND SCREEN
ABO/RH(D): A POS
Antibody Screen: NEGATIVE
Unit division: 0
Unit division: 0

## 2020-08-04 LAB — CBC
HCT: 30.2 % — ABNORMAL LOW (ref 39.0–52.0)
Hemoglobin: 9 g/dL — ABNORMAL LOW (ref 13.0–17.0)
MCH: 19.9 pg — ABNORMAL LOW (ref 26.0–34.0)
MCHC: 29.8 g/dL — ABNORMAL LOW (ref 30.0–36.0)
MCV: 66.8 fL — ABNORMAL LOW (ref 80.0–100.0)
Platelets: 491 10*3/uL — ABNORMAL HIGH (ref 150–400)
RBC: 4.52 MIL/uL (ref 4.22–5.81)
RDW: 20.3 % — ABNORMAL HIGH (ref 11.5–15.5)
WBC: 13.4 10*3/uL — ABNORMAL HIGH (ref 4.0–10.5)
nRBC: 0 % (ref 0.0–0.2)

## 2020-08-04 LAB — HIV ANTIBODY (ROUTINE TESTING W REFLEX): HIV Screen 4th Generation wRfx: NONREACTIVE

## 2020-08-04 SURGERY — COLONOSCOPY WITH PROPOFOL
Anesthesia: Moderate Sedation

## 2020-08-04 MED ORDER — MIDAZOLAM HCL (PF) 10 MG/2ML IJ SOLN
INTRAMUSCULAR | Status: DC | PRN
  Administered 2020-08-04 (×3): 1 mg via INTRAVENOUS

## 2020-08-04 MED ORDER — FENTANYL CITRATE (PF) 100 MCG/2ML IJ SOLN
INTRAMUSCULAR | Status: DC | PRN
  Administered 2020-08-04 (×4): 25 ug via INTRAVENOUS
  Administered 2020-08-04: 50 ug via INTRAVENOUS

## 2020-08-04 MED ORDER — BUTAMBEN-TETRACAINE-BENZOCAINE 2-2-14 % EX AERO
INHALATION_SPRAY | CUTANEOUS | Status: DC | PRN
  Administered 2020-08-04: 2 via TOPICAL

## 2020-08-04 MED ORDER — FENTANYL CITRATE (PF) 100 MCG/2ML IJ SOLN
INTRAMUSCULAR | Status: AC
  Filled 2020-08-04: qty 4

## 2020-08-04 MED ORDER — LACTATED RINGERS IV SOLN: Freq: Once | INTRAVENOUS | Status: AC

## 2020-08-04 MED ORDER — SODIUM CHLORIDE 0.9 % IV SOLN: INTRAVENOUS | Status: DC

## 2020-08-04 MED ORDER — PANTOPRAZOLE SODIUM 40 MG PO TBEC
40.0000 mg | DELAYED_RELEASE_TABLET | Freq: Every day | ORAL | Status: DC
  Administered 2020-08-04 – 2020-08-07 (×4): 40 mg via ORAL
  Filled 2020-08-04 (×4): qty 1

## 2020-08-04 MED ORDER — METHYLPREDNISOLONE SODIUM SUCC 40 MG IJ SOLR
20.0000 mg | INTRAMUSCULAR | Status: DC
  Administered 2020-08-04 – 2020-08-06 (×7): 20 mg via INTRAVENOUS
  Filled 2020-08-04 (×9): qty 1

## 2020-08-04 MED ORDER — DIPHENHYDRAMINE HCL 50 MG/ML IJ SOLN
INTRAMUSCULAR | Status: DC | PRN
  Administered 2020-08-04: 50 mg via INTRAVENOUS

## 2020-08-04 MED ORDER — DIPHENHYDRAMINE HCL 50 MG/ML IJ SOLN
INTRAMUSCULAR | Status: AC
  Filled 2020-08-04: qty 2

## 2020-08-04 MED ORDER — MIDAZOLAM HCL (PF) 5 MG/ML IJ SOLN
INTRAMUSCULAR | Status: AC
  Filled 2020-08-04: qty 3

## 2020-08-04 MED ORDER — LIDOCAINE VISCOUS HCL 2 % MT SOLN
OROMUCOSAL | Status: AC
  Filled 2020-08-04: qty 15

## 2020-08-04 SURGICAL SUPPLY — 22 items

## 2020-08-04 NOTE — Plan of Care (Signed)
  Problem: Activity: Goal: Risk for activity intolerance will decrease Outcome: Progressing   

## 2020-08-04 NOTE — Op Note (Addendum)
[facility] Patient Name: Isaiah Carpenter Procedure Date : 08/04/2020 MRN: [id number] Attending MD: Corliss Parish , MD Date of Birth: March 31, 1995 CSN: [id number] Age: 25 Admit Type: Inpatient Procedure:                Upper GI endoscopy Indications:              Generalized abdominal pain, Hematochezia, Abnormal                            CT of the GI tract, Abdominal bloating Providers:                Corliss Parish, MD, Estella Husk RN, RN, Brion Aliment, Technician Referring MD:             [facility] Medicines:                Cetacaine spray, Diphenhydramine 50 mg IV, Fentanyl                            150 micrograms IV, Midazolam 3 mg IV (total                            medication for both EGD/Colonoscopy) Complications:            No immediate complications. Estimated Blood Loss:     Estimated blood loss was minimal. Procedure:                Pre-Anesthesia Assessment:                           - Prior to the procedure, a History and Physical                            was performed, and patient medications and                            allergies were reviewed. The patient's tolerance of                            previous anesthesia was also reviewed. The risks                            and benefits of the procedure and the sedation                            options and risks were discussed with the patient.                            All questions were answered, and informed consent                            was obtained. Prior Anticoagulants: The patient has  taken no previous anticoagulant or antiplatelet                            agents. ASA Grade Assessment: II - A patient with                            mild systemic disease. After reviewing the risks                            and benefits, the patient was deemed in                            satisfactory condition to undergo the  procedure.                           After obtaining informed consent, the endoscope was                            passed under direct vision. Throughout the                            procedure, the patient's blood pressure, pulse, and                            oxygen saturations were monitored continuously. The                            GIF-H190 ([id number]) Olympus gastroscope was                            introduced through the mouth, and advanced to the                            second part of duodenum. The upper GI endoscopy was                            accomplished without difficulty. The patient                            tolerated the procedure. Scope In: Scope Out: Findings:      No gross lesions were noted in the proximal esophagus and in the mid       esophagus.      LA Grade C (one or more mucosal breaks continuous between tops of 2 or       more mucosal folds, less than 75% circumference) esophagitis with no       bleeding was found in the distal esophagus. Biopsies were taken with a       cold forceps for histology.      Patchy mildly erythematous mucosa without bleeding was found in the       entire examined stomach. Biopsies were taken with a cold forceps for       histology and Helicobacter pylori testing.      Diffuse moderate inflammation characterized by congestion (edema),       erythema, friability and granularity  was found in the duodenal bulb, in       the first portion of the duodenum and in the second portion of the       duodenum. Biopsies were taken with a cold forceps for histology. Impression:               - No gross lesions in esophagus.                           - LA Grade C erosive esophagitis with no bleeding.                            Biopsied.                           - Erythematous mucosa in the stomach. Biopsied.                           - Duodenitis. Biopsied. Moderate Sedation:      Moderate (conscious) sedation was administered by the  endoscopy nurse       and supervised by the endoscopist. The following parameters were       monitored: oxygen saturation, heart rate, blood pressure, and response       to care. Total physician intraservice time was 36 minutes. Recommendation:           - Proceed to scheduled colonoscopy.                           - Start Protonix 40 mg daily.                           - Observe patient's clinical course.                           - Await pathology results.                           - Repeat upper endoscopy in 4 months to check                            healing of esophagitis.                           - The findings and recommendations were discussed                            with the patient.                           - The findings and recommendations were discussed                            with the patient's family. Procedure Code(s):        --- Professional ---                           [phone], Esophagogastroduodenoscopy, flexible,  transoral; with biopsy, single or multiple                           99153, Moderate sedation; each additional 15                            minutes intraservice time                           G0500, Moderate sedation services provided by the                            same physician or other qualified health care                            professional performing a gastrointestinal                            endoscopic service that sedation supports,                            requiring the presence of an independent trained                            observer to assist in the monitoring of the                            patient's level of consciousness and physiological                            status; initial 15 minutes of intra-service time;                            patient age 68 years or older (additional time may                            be reported with [id number], as appropriate) Diagnosis Code(s):        ---  Professional ---                           K20.80, Other esophagitis without bleeding                           K31.89, Other diseases of stomach and duodenum                           K29.80, Duodenitis without bleeding                           R10.84, Generalized abdominal pain                           K92.1, Melena (includes Hematochezia)                           R14.0, Abdominal distension (gaseous)  R93.3, Abnormal findings on diagnostic imaging of                            other parts of digestive tract CPT copyright 2019 [employer]. All rights reserved. The codes documented in this report are preliminary and upon coder review may  be revised to meet current compliance requirements. Corliss Parish, MD 08/04/2020 4:39:23 PM Number of Addenda: 0

## 2020-08-04 NOTE — Interval H&P Note (Signed)
History and Physical Interval Note:  08/04/2020 3:01 PM  Isaiah Carpenter  has presented today for surgery, with the diagnosis of Colitis involving sigmoid and rectum.  Bloody diarrhea.  Abdominal pain.  Symptoms present for 6 months..  The various methods of treatment have been discussed with the patient and family. After consideration of risks, benefits and other options for treatment, the patient has consented to  Procedure(s): COLONOSCOPY WITH PROPOFOL (N/A) ESOPHAGOGASTRODUODENOSCOPY (EGD) (N/A) as a surgical intervention.  The patient's history has been reviewed, patient examined, no change in status, stable for surgery.  I have reviewed the patient's chart and labs.  Questions were answered to the patient's satisfaction.     [employer]

## 2020-08-04 NOTE — Op Note (Addendum)
[facility] Patient Name: Isaiah Carpenter Procedure Date : 08/04/2020 MRN: [id number] Attending MD: Justice Britain , MD Date of Birth: December 13, 1995 CSN: [id number] Age: 25 Admit Type: Inpatient Procedure:                Colonoscopy Indications:              Generalized abdominal pain, Hematochezia, Rectal                            bleeding, Abnormal CT of the GI tract, Colitis Providers:                Justice Britain, MD, Kary Kos RN, RN, Ladona Ridgel, Technician Referring MD:             [facility] Medicines:                Cetacaine spray, Diphenhydramine 50 mg IV, Fentanyl                            150 micrograms IV, Midazolam 3 mg IV (total                            medication for both EGD/Colonoscopy) Complications:            No immediate complications. Estimated Blood Loss:     Estimated blood loss was minimal. Procedure:                Pre-Anesthesia Assessment:                           - Prior to the procedure, a History and Physical                            was performed, and patient medications and                            allergies were reviewed. The patient's tolerance of                            previous anesthesia was also reviewed. The risks                            and benefits of the procedure and the sedation                            options and risks were discussed with the patient.                            All questions were answered, and informed consent                            was obtained. Prior Anticoagulants: The patient has  taken no previous anticoagulant or antiplatelet                            agents. ASA Grade Assessment: II - A patient with                            mild systemic disease. After reviewing the risks                            and benefits, the patient was deemed in                            satisfactory condition to undergo the  procedure.                           After obtaining informed consent, the colonoscope                            was passed under direct vision. Throughout the                            procedure, the patient's blood pressure, pulse, and                            oxygen saturations were monitored continuously. The                            PCF-H190DL ([id number] Olympus pediatric colonoscope                            was introduced through the anus and advanced to the                            the transverse colon for evaluation. This was the                            intended extent. The colonoscopy was performed                            without difficulty. The patient tolerated the                            procedure. 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      The digital rectal exam findings include rectal tenderness.      Inflammation was found in a continuous and circumferential pattern from       the anus to the descending colon. This was graded as Mayo Score 3       (severe, with spontaneous bleeding, ulcerations). Biopsies were taken       with a cold forceps for histology.      A large amount of semi-solid solid stool was found at the splenic  flexure, interfering with visualization.      Non-bleeding non-thrombosed external and internal hemorrhoids were found       during perianal exam, during digital exam and during endoscopy. The       hemorrhoids were Grade II (internal hemorrhoids that prolapse but reduce       spontaneously).      Colonoscopy was incomplete due to the amount of solid stool present -       suspect that this is a result of the inflammation/congestion/swelling       that has led to this not being able to pass. 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                            - Rectal tenderness found on digital rectal exam.                           - Severe (Mayo Score 3) inflammatory bowel disease.                            Biopsied.                           - Stool at the splenic flexure.                           - Non-bleeding non-thrombosed external and internal                            hemorrhoids.                           - Colonoscopy was incomplete due to the amount of                            solid stool present - suspect that this is a result                            of the inflammation/congestion/swelling that has                            led to this not being able to pass. Moderate Sedation:      Moderate (conscious) sedation was administered by the endoscopy nurse       and supervised by the endoscopist. The following parameters were       monitored: oxygen saturation, heart rate, blood pressure, and response       to care. Total physician intraservice time was 36 minutes. Recommendation:           - The patient will be observed post-procedure,                            until all discharge criteria are met.                           - Return patient to hospital ward for ongoing  care.                           - Advance diet as tolerated.                           - Start Solumedrol 20 mg Q8H.                           - ESR/CRP to be added on or done tomorrow.                           - HAV Ab, HBsAg, HBsAb, HBcAb, HCV Ab, Quantiferon                            Gold, VZV Ab, Vitamin D to be done tomorrow.                           - Await pathology results.                           - Patient will need Colorectal Surgery evaluation                            due to perianal findings suggestive of condylomata.                           - Repeat colonoscopy to check healing based on                            clinical progress in the coming months.                           - Suspect IBD diagnosis UC most likely.                            - The findings and recommendations were discussed                            with the patient.                           - The findings and recommendations were discussed                            with the patient's family.                           - The findings and recommendations were discussed                            with the referring physician. Procedure Code(s):        --- Professional ---                           [phone], 21, Colonoscopy,  flexible; with biopsy,                            single or multiple                           99153, Moderate sedation; each additional 15                            minutes intraservice time                           G0500, Moderate sedation services provided by the                            same physician or other qualified health care                            professional performing a gastrointestinal                            endoscopic service that sedation supports,                            requiring the presence of an independent trained                            observer to assist in the monitoring of the                            patient's level of consciousness and physiological                            status; initial 15 minutes of intra-service time;                            patient age 4 years or older (additional time may                            be reported with [phone], as appropriate) Diagnosis Code(s):        --- Professional ---                           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, Anogenital (venereal) warts                           K62.89, Other specified diseases of anus and rectum                           K52.3, Indeterminate colitis                           K64.1, Second degree hemorrhoids                           R10.84, Generalized abdominal pain  K92.1, Melena (includes Hematochezia)                           K62.5, Hemorrhage of anus and rectum                           K52.9,  Noninfective gastroenteritis and colitis,                            unspecified                           R93.3, Abnormal findings on diagnostic imaging of                            other parts of digestive tract CPT copyright 2019 [employer]. All rights reserved. The codes documented in this report are preliminary and upon coder review may  be revised to meet current compliance requirements. Justice Britain, MD 08/04/2020 4:49:30 PM Number of Addenda: 0

## 2020-08-04 NOTE — Progress Notes (Signed)
Pt states it is ok to speak with mother about procedure. She would like an update [phone] Belenda Cruise.

## 2020-08-04 NOTE — Plan of Care (Signed)
  Problem: Clinical Measurements: Goal: Ability to maintain clinical measurements within normal limits will improve Outcome: Completed/Met Goal: Will remain free from infection Outcome: Completed/Met Goal: Diagnostic test results will improve Outcome: Completed/Met Goal: Respiratory complications will improve Outcome: Completed/Met   

## 2020-08-04 NOTE — Progress Notes (Signed)
PROGRESS NOTE    Isaiah Ausyler Lantis  ZOX:[id number] DOB: May 06, 1995 DOA: 08/02/2020 PCP: Pcp, No   Brief Narrative:  HPI: Isaiah Carpenter is a 25 y.o. male with no known medical history who presented for 4 month history of intermittent lower abdominal pain that became progressively worse over the weekend.  It is located in lower abdomen with radiation into the bilateral lower quadrants. He rates the pain as 8 out of 10 and describes it as sharp. Originally was intermittent,  but over the weekend the pain become constant and unbearable. Also has pain in his rectum with BM. Some food  can make it worse.  He denies any diarrhea.  He has had intermittent blood in his stool over the past 4 days as well.  He does have a history of hemorrhoids.  He states he also started going to the bathroom every hour and only produce a small amount of stool.  He denies any history of colon cancer or inflammatory bowel disease in his family. He does not use NSAIDs.  He denies any ulcers in his mouth.  He is in a monogamous relationship with his male partner.  They were both tested for STDs in December of 2021 and negative.  Weight loss of 10 pounds since February. He has been very fatigued recently. Denies any shortness of breath. Denies any easy easy bruising or bleeding.       ED Course: vitals: bp: 110/75, hr: 102, afebrile, RR: 17 and oxygen: 100% RA. B of 7.3/hct of 26.3 and platelets of 754 stable compared to labs 6 months ago. CT showed systemic wall thickening involving the rectum extending to the level of hte mid sigmoid colon with some adjacent inflammatory stranding and prominent vasa rectal lymph nodes. GI consulted for proctitis/colitis. We were asked to admit for anemia and Gi plans for cscope tomorrow.   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  He is scheduled  for colonoscopy today.  Still with pain and had 1 bloody bowel movement this morning.  Defer further management to GI.  Iron deficiency anemia: Presented with hemoglobin 6.4.  Received 2 units hypericin oxygen and currently hemoglobin 9.0.  Will benefit from iron supplements at discharge.  Leukocytosis: Nonspecific.  Could very well be due to ulcerative colitis if that is his diagnosis.  Monitor.  No signs of infection.  Mild hyponatremia: Likely hypovolemic.  Will start on normal saline.  Recheck in the morning.  DVT prophylaxis: SCDs Start: 08/03/20 1754   Code Status: Full Code  Family Communication: None present at bedside.  Plan of care discussed with patient in length and he verbalized understanding and agreed with it.  Also discussed with his mother over the phone on speaker in his room.  Answered several questions.  Status is: Inpatient  Remains inpatient appropriate because:Ongoing diagnostic testing needed not appropriate for outpatient work up  Dispo: The patient is from: Home              Anticipated d/c is to: Home              Patient currently is not medically stable to d/c.   Difficult to place patient No        Estimated body mass index is 17.19 kg/m as calculated from the following:   Height as of this encounter: 5\' 6"  (1.676 m).  Weight as of this encounter: 48.3 kg.      Nutritional status:               Consultants:  GI  Procedures:  None  Antimicrobials:  Anti-infectives (From admission, onward)    Start     Dose/Rate Route Frequency Ordered Stop   08/03/20 1430  cefTRIAXone (ROCEPHIN) injection 500 mg        500 mg Intramuscular Once 08/03/20 1425 08/03/20 1439   08/03/20 1430  azithromycin (ZITHROMAX) tablet 1,000 mg        1,000 mg Oral  Once 08/03/20 1425 08/03/20 1438          Subjective: Patient seen and examined.  Still complains of rectal pain and lower abdominal pain with no improvement compared to yesterday.  No  other complaint.  Had 1 more bloody bowel movement.  Objective: Vitals:   08/03/20 2330 08/04/20 0137 08/04/20 0615 08/04/20 0944  BP: 112/70 113/74 114/74 118/78  Pulse: 90 80 99 99  Resp:  16 15 17   Temp: 98.2 F (36.8 C) 98.4 F (36.9 C) 99.8 F (37.7 C) 98.6 F (37 C)  TempSrc: Oral Oral Oral Oral  SpO2: 97% 99% 100% 99%  Weight:  48.3 kg    Height:        Intake/Output Summary (Last 24 hours) at 08/04/2020 1113 Last data filed at 08/04/2020 0800 Gross per 24 hour  Intake 2140 ml  Output --  Net 2140 ml   Filed Weights   08/02/20 2341 08/04/20 0137  Weight: 65.8 kg 48.3 kg    Examination:  General exam: Appears calm and comfortable  Respiratory system: Clear to auscultation. Respiratory effort normal. Cardiovascular system: S1 & S2 heard, RRR. No JVD, murmurs, rubs, gallops or clicks. No pedal edema. Gastrointestinal system: Abdomen is nondistended, soft and suprapubic tenderness, no organomegaly or masses felt. Normal bowel sounds heard. Central nervous system: Alert and oriented. No focal neurological deficits. Extremities: Symmetric 5 x 5 power. Skin: No rashes, lesions or ulcers Psychiatry: Judgement and insight appear normal. Mood & affect appropriate.    Data Reviewed: I have personally reviewed following labs and imaging studies  CBC: Recent Labs  Lab 08/03/20 1013 08/03/20 1418 08/03/20 1555 08/04/20 0346  WBC 13.2* 15.2* 12.5* 13.4*  NEUTROABS  --  11.6*  --   --   HGB 7.3* 7.5* 6.4* 9.0*  HCT 26.3* 27.3* 23.4* 30.2*  MCV 65.1* 65.3* 65.2* 66.8*  PLT 754* 747* 627* 491*   Basic Metabolic Panel: Recent Labs  Lab 08/03/20 1013  NA 132*  K 4.1  CL 98  CO2 26  GLUCOSE 102*  BUN 8  CREATININE 1.02  CALCIUM 9.6   GFR: Estimated Creatinine Clearance: 76.9 mL/min (by C-G formula based on SCr of 1.02 mg/dL). Liver Function Tests: Recent Labs  Lab 08/03/20 1013  AST 13*  ALT 9  ALKPHOS 47  BILITOT 0.4  PROT 7.1  ALBUMIN 2.7*    Recent Labs  Lab 08/03/20 1013  LIPASE 18   No results for input(s): AMMONIA in the last 168 hours. Coagulation Profile: No results for input(s): INR, PROTIME in the last 168 hours. Cardiac Enzymes: No results for input(s): CKTOTAL, CKMB, CKMBINDEX, TROPONINI in the last 168 hours. BNP (last 3 results) No results for input(s): PROBNP in the last 8760 hours. HbA1C: No results for input(s): HGBA1C in the last 72 hours. CBG: No results for input(s): GLUCAP in the last 168 hours. Lipid Profile: No results for input(s):  CHOL, HDL, LDLCALC, TRIG, CHOLHDL, LDLDIRECT in the last 72 hours. Thyroid Function Tests: No results for input(s): TSH, T4TOTAL, FREET4, T3FREE, THYROIDAB in the last 72 hours. Anemia Panel: Recent Labs    08/03/20 1248  VITAMINB12 343  FOLATE 16.8  FERRITIN 10*  TIBC 410  IRON 9*  RETICCTPCT 1.7   Sepsis Labs: No results for input(s): PROCALCITON, LATICACIDVEN in the last 168 hours.  Recent Results (from the past 240 hour(s))  SARS CORONAVIRUS 2 (TAT 6-24 HRS) Nasopharyngeal Nasopharyngeal Swab     Status: None   Collection Time: 08/03/20  3:55 PM   Specimen: Nasopharyngeal Swab  Result Value Ref Range Status   SARS Coronavirus 2 NEGATIVE NEGATIVE Final    Comment: (NOTE) SARS-CoV-2 target nucleic acids are NOT DETECTED.  The SARS-CoV-2 RNA is generally detectable in upper and lower respiratory specimens during the acute phase of infection. Negative results do not preclude SARS-CoV-2 infection, do not rule out co-infections with other pathogens, and should not be used as the sole basis for treatment or other patient management decisions. Negative results must be combined with clinical observations, patient history, and epidemiological information. The expected result is Negative.  Fact Sheet for Patients: HairSlick.no  Fact Sheet for Healthcare Providers: quierodirigir.com  This test is not  yet approved or cleared by the [place] FDA and  has been authorized for detection and/or diagnosis of SARS-CoV-2 by FDA under an Emergency Use Authorization (EUA). This EUA will remain  in effect (meaning this test can be used) for the duration of the COVID-19 declaration under Se ction 564(b)(1) of the Act, 21 U.S.C. section 360bbb-3(b)(1), unless the authorization is terminated or revoked sooner.  Performed at [facility], [address] [place], [place] [address]       Radiology Studies: CT ABDOMEN PELVIS W CONTRAST  Result Date: 08/03/2020 CLINICAL DATA:  Right lower quadrant abdominal pain, 2 months of constipation with intermittent urgency EXAM: CT ABDOMEN AND PELVIS WITH CONTRAST TECHNIQUE: Multidetector CT imaging of the abdomen and pelvis was performed using the standard protocol following bolus administration of intravenous contrast. CONTRAST:  OMNIPAQUE IOHEXOL 300 MG/ML  SOLN COMPARISON:  None. FINDINGS: Lower chest: No acute abnormality. Normal size heart. No significant pericardial effusion/thickening. Hepatobiliary: No suspicious hepatic lesion. Cholelithiasis without findings of acute cholecystitis. No biliary ductal dilation. Pancreas: Within normal limits. Spleen: Within normal limits. Adrenals/Urinary Tract: Adrenal glands are unremarkable. Kidneys are normal, without renal calculi, focal lesion, or hydronephrosis. Bladder is unremarkable. Stomach/Bowel: Symmetric wall thickening involving the rectum extending to the level of the mid sigmoid colon for instance on coronal image 54 and axial image 69. Large volume of formed stool throughout the remainder of the colon. The appendix is grossly unremarkable. The terminal ileum is unremarkable. No pathologic dilation of small bowel. Stomach is grossly unremarkable. Vascular/Lymphatic: No significant vascular findings are present. Prominent mesorectal lymph nodes measuring up to 5 mm on image 65/3, favored reactive.  No pathologically enlarged abdominal or pelvic lymph nodes. Reproductive: Prostate is unremarkable. Other: No abdominopelvic ascites. Musculoskeletal: No acute or significant osseous findings. IMPRESSION: 1. Symmetric wall thickening involving the rectum extending to the level of the mid sigmoid colon with some adjacent inflammatory stranding and prominent mesorectal lymph nodes. Findings most consistent with infectious or inflammatory proctitis/colitis including ulcerative colitis. Gastroenterology consult suggested. 2. Large volume of formed stool throughout the remainder of the colon suggesting constipation. 3. Cholelithiasis without findings of acute cholecystitis. Electronically Signed   By: Maudry Mayhew  MD   On: 08/03/2020 14:02    Scheduled Meds:  sodium chloride   Intravenous Once   sodium chloride flush  3 mL Intravenous Q12H   Continuous Infusions:  sodium chloride       LOS: 1 day   Time spent: 35 minutes   Hughie Closs, MD [facility]  08/04/2020, 11:13 AM   How to contact the [facility] Attending or Consulting provider 7A - 7P or covering provider during after hours 7P -7A, for this patient?  Check the care team in [facility] and look for a) attending/consulting [facility] provider listed and b) the [facility] team listed. Page or secure chat 7A-7P. Log into [email or web address] and use [place]'s universal password to access. If you do not have the password, please contact the hospital operator. Locate the [facility] provider you are looking for under [facility] and page to a number that you can be directly reached. If you still have difficulty reaching the provider, please page the [facility] (Director on Call) for the Hospitalists listed on amion for assistance.

## 2020-08-04 NOTE — Anesthesia Preprocedure Evaluation (Deleted)
Anesthesia Evaluation  Patient identified by MRN, date of birth, ID band Patient awake    Airway Mallampati: II  TM Distance: >3 FB Neck ROM: Full    Dental  (+) Teeth Intact   Pulmonary neg pulmonary ROS, Current Smoker,    Pulmonary exam normal        Cardiovascular negative cardio ROS   Rhythm:Regular Rate:Normal     Neuro/Psych negative neurological ROS  negative psych ROS   GI/Hepatic Neg liver ROS, Colitis    Endo/Other  negative endocrine ROS  Renal/GU negative Renal ROS  negative genitourinary   Musculoskeletal negative musculoskeletal ROS (+)   Abdominal (+)  Abdomen: soft. Bowel sounds: normal.  Peds  Hematology  (+) anemia ,   Anesthesia Other Findings   Reproductive/Obstetrics                            Anesthesia Physical Anesthesia Plan  ASA: 2  Anesthesia Plan: MAC   Post-op Pain Management:    Induction: Intravenous  PONV Risk Score and Plan: 1 and Propofol infusion and Treatment may vary due to age or medical condition  Airway Management Planned: Simple Face Mask, Natural Airway and Nasal Cannula  Additional Equipment: None  Intra-op Plan:   Post-operative Plan:   Informed Consent: I have reviewed the patients History and Physical, chart, labs and discussed the procedure including the risks, benefits and alternatives for the proposed anesthesia with the patient or authorized representative who has indicated his/her understanding and acceptance.     Dental advisory given  Plan Discussed with: CRNA  Anesthesia Plan Comments: (- DOS update: planned case under procedural sedation without anesthesia assistance.    Lab Results      Component                Value               Date                      WBC                      13.4 (H)            08/04/2020                HGB                      9.0 (L)             08/04/2020                HCT                       30.2 (L)            08/04/2020                MCV                      66.8 (L)            08/04/2020                PLT                      491 (H)             08/04/2020  Lab Results      Component                Value               Date                      NA                       132 (L)             08/03/2020                K                        4.1                 08/03/2020                CO2                      26                  08/03/2020                GLUCOSE                  102 (H)             08/03/2020                BUN                      8                   08/03/2020                CREATININE               1.02                08/03/2020                CALCIUM                  9.6                 08/03/2020                GFRNONAA                 >60                 08/03/2020          )       Anesthesia Quick Evaluation

## 2020-08-05 DIAGNOSIS — A630 Anogenital (venereal) warts: Secondary | ICD-10-CM

## 2020-08-05 DIAGNOSIS — R933 Abnormal findings on diagnostic imaging of other parts of digestive tract: Secondary | ICD-10-CM

## 2020-08-05 DIAGNOSIS — K2980 Duodenitis without bleeding: Secondary | ICD-10-CM

## 2020-08-05 DIAGNOSIS — K209 Esophagitis, unspecified without bleeding: Secondary | ICD-10-CM

## 2020-08-05 DIAGNOSIS — K2100 Gastro-esophageal reflux disease with esophagitis, without bleeding: Secondary | ICD-10-CM

## 2020-08-05 DIAGNOSIS — K649 Unspecified hemorrhoids: Secondary | ICD-10-CM

## 2020-08-05 DIAGNOSIS — K529 Noninfective gastroenteritis and colitis, unspecified: Secondary | ICD-10-CM

## 2020-08-05 DIAGNOSIS — D500 Iron deficiency anemia secondary to blood loss (chronic): Secondary | ICD-10-CM

## 2020-08-05 DIAGNOSIS — K2970 Gastritis, unspecified, without bleeding: Secondary | ICD-10-CM

## 2020-08-05 DIAGNOSIS — K51511 Left sided colitis with rectal bleeding: Secondary | ICD-10-CM

## 2020-08-05 LAB — CBC WITH DIFFERENTIAL/PLATELET
Abs Immature Granulocytes: 0 10*3/uL (ref 0.00–0.07)
Basophils Absolute: 0 10*3/uL (ref 0.0–0.1)
Basophils Relative: 0 %
Eosinophils Absolute: 0 10*3/uL (ref 0.0–0.5)
Eosinophils Relative: 0 %
HCT: 29.5 % — ABNORMAL LOW (ref 39.0–52.0)
Hemoglobin: 8.5 g/dL — ABNORMAL LOW (ref 13.0–17.0)
Lymphocytes Relative: 10 %
Lymphs Abs: 1.1 10*3/uL (ref 0.7–4.0)
MCH: 20 pg — ABNORMAL LOW (ref 26.0–34.0)
MCHC: 28.8 g/dL — ABNORMAL LOW (ref 30.0–36.0)
MCV: 69.4 fL — ABNORMAL LOW (ref 80.0–100.0)
Monocytes Absolute: 0.4 10*3/uL (ref 0.1–1.0)
Monocytes Relative: 4 %
Neutro Abs: 9.4 10*3/uL — ABNORMAL HIGH (ref 1.7–7.7)
Neutrophils Relative %: 86 %
Platelets: 587 10*3/uL — ABNORMAL HIGH (ref 150–400)
RBC: 4.25 MIL/uL (ref 4.22–5.81)
RDW: 20.9 % — ABNORMAL HIGH (ref 11.5–15.5)
WBC: 10.9 10*3/uL — ABNORMAL HIGH (ref 4.0–10.5)
nRBC: 0 % (ref 0.0–0.2)
nRBC: 0 /100 WBC

## 2020-08-05 LAB — VITAMIN D 25 HYDROXY (VIT D DEFICIENCY, FRACTURES): Vit D, 25-Hydroxy: 20.89 ng/mL — ABNORMAL LOW (ref 30–100)

## 2020-08-05 LAB — SEDIMENTATION RATE: Sed Rate: 27 mm/hr — ABNORMAL HIGH (ref 0–16)

## 2020-08-05 LAB — BASIC METABOLIC PANEL
Anion gap: 10 (ref 5–15)
BUN: 6 mg/dL (ref 6–20)
CO2: 25 mmol/L (ref 22–32)
Calcium: 9 mg/dL (ref 8.9–10.3)
Chloride: 101 mmol/L (ref 98–111)
Creatinine, Ser: 0.79 mg/dL (ref 0.61–1.24)
GFR, Estimated: >60 mL/min (ref >60)
Glucose, Bld: 105 mg/dL — ABNORMAL HIGH (ref 70–99)
Potassium: 4.1 mmol/L (ref 3.5–5.1)
Sodium: 136 mmol/L (ref 135–145)

## 2020-08-05 LAB — HEPATITIS B SURFACE ANTIGEN: Hepatitis B Surface Ag: NONREACTIVE

## 2020-08-05 LAB — HEPATITIS A ANTIBODY, TOTAL: hep A Total Ab: REACTIVE — AB

## 2020-08-05 LAB — C-REACTIVE PROTEIN: CRP: 9.8 mg/dL — ABNORMAL HIGH (ref <1.0)

## 2020-08-05 LAB — HEPATITIS B CORE ANTIBODY, TOTAL: Hep B Core Total Ab: NONREACTIVE

## 2020-08-05 MED ORDER — PHENYLEPHRINE IN HARD FAT 0.25 % RE SUPP: 1.0000 | Freq: Two times a day (BID) | RECTAL | Status: DC | PRN

## 2020-08-05 MED ORDER — HYDROCORTISONE ACETATE 25 MG RE SUPP
25.0000 mg | Freq: Two times a day (BID) | RECTAL | Status: DC | PRN
  Administered 2020-08-05: 25 mg via RECTAL
  Filled 2020-08-05: qty 1

## 2020-08-05 MED ORDER — DIPHENHYDRAMINE HCL 25 MG PO CAPS: 25.0000 mg | ORAL_CAPSULE | Freq: Four times a day (QID) | ORAL | Status: DC | PRN

## 2020-08-05 MED ORDER — SODIUM CHLORIDE 0.9 % IV SOLN
510.0000 mg | Freq: Once | INTRAVENOUS | Status: DC
  Filled 2020-08-05: qty 17

## 2020-08-05 NOTE — Progress Notes (Signed)
PROGRESS NOTE  Isaiah Carpenter EBR:[id number] DOB: [birth year]   PCP: Pcp, No  Patient is from: Home  DOA: 08/02/2020 LOS: 2  Chief complaints: Abdominal pain  Brief Narrative / Interim history: 25 year old M with PMH of hemorrhoids presenting with intermittent lower abdominal pain for 4 months that has progressively gotten worse for few days prior to the presentation.  He also had pain with bowel movement, increased frequency of BM and blood in stool.  He is in monogamous relation with his male partner.  CT abdomen and pelvis showed symmetric wall thickening involving the rectum extending to the level of mid sigmoid colon with some adjacent inflammatory stranding and prominent mesorectal lymph nodes concerning for infectious or inflammatory proctitis/colitis including UC.  Inflammatory markers elevated.  GI consulted.  EGD revealed large grade C erosive esophagitis, erythematous gastric mucosa and duodenitis but no active bleed.  Colonoscopy although incomplete showed perianal condylomata, severe inflammatory bowel disease, nonbleeding and nonthrombosed external and internal hemorrhoids.  Patient was started on systemic steroid for possible UC.  Not a candidate for mesalamine due to NSAID allergy.  GI following.  Subjective: Seen and examined earlier this morning.  Continues to have pain with bowel movement.  Also reports small-volume bowel movements almost every 2 hours.  Still with some blood but no frank hematochezia or blood clot.  Reports low abdominal pain as well.  Denies UTI symptoms, nausea, vomiting, chest pain or dyspnea.  Denies fever or chills.  Objective: Vitals:   08/04/20 1758 08/04/20 1941 08/05/20 0502 08/05/20 0937  BP: 116/80 (!) 97/51 104/64 128/71  Pulse: 93 73 (!) 59 65  Resp:  _0 Temp: 99 F (37.2 C) 98.8 F (37.1 C) 98 F (36.7 C) 98.3 F (36.8 C)  TempSrc: Oral Oral Oral Oral  SpO2: 100% 98% 100% 100%  Weight:      Height:        Intake/Output Summary  (Last 24 hours) at 08/05/2020 1357 Last data filed at 08/05/2020 1317 Gross per 24 hour  Intake 3266.77 ml  Output 0 ml  Net 3266.77 ml   Filed Weights   08/02/20 2341 08/04/20 0137  Weight: 65.8 kg 48.3 kg    Examination:  GENERAL: No apparent distress.  Nontoxic. HEENT: MMM.  Vision and hearing grossly intact.  NECK: Supple.  No apparent JVD.  RESP: On RA.  No IWOB.  Fair aeration bilaterally. CVS:  RRR. Heart sounds normal.  ABD/GI/GU: BS+. Abd soft.  Tenderness across lower abdomen.  No rebound or guarding. MSK/EXT:  Moves extremities. No apparent deformity. No edema.  SKIN: no apparent skin lesion or wound NEURO: Awake, alert and oriented appropriately.  No apparent focal neuro deficit. PSYCH: Calm. Normal affect.   Procedures:  EGD revealed large grade C erosive esophagitis, erythematous gastric mucosa and duodenitis but no active bleed.   Colonoscopy although incomplete showed perianal condylomata, severe inflammatory bowel disease, nonbleeding and nonthrombosed external and internal hemorrhoids.  Microbiology summarized: COVID-19 PCR nonreactive.  Assessment & Plan: Proctitis/possible IBD-EGD and colonoscopy as above.  CRP 9.8.  ESR 27.  HIV nonreactive.  Pathology pending. -Continue IV Solu-Medrol per GI -Patient has NSAID allergies, and not a candidate for ASA or sulfasalazine or Row[place]. -Follow-up pathology, fecal calprotectin and other speciality labs -GI managing  Esophagitis/gastritis/duodenitis- -Continue PPI  Iron deficiency anemia: H&H seems to be stable after 2 units.  Anemia panel with severe iron deficiency.  Patient had some chest tightness and flushing after IV iron initiation Recent Labs  01/27/20 1528 08/03/20 1013 08/03/20 1418 08/03/20 1555 08/04/20 0346 08/05/20 0500  HGB 7.6* 7.3* 7.5* 6.4* 9.0* 8.5*  -IV iron stopped.  We will try p.o. iron.  He is already on Solu-Medrol. -Benadryl as needed for allergy -Monitor H&H   Leukocytosis:  Improved.  Likely due to #1 -Monitor.  Vitamin D insufficiency: Vitamin D level 20.89. -P.o. vitamin D  Hyponatremia: Resolved.  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  Never been vaccinated for HPV. -May need follow-up with general surgery outpatient   Thrombocytosis: Improved. Recent Labs  Lab 08/03/20 1013 08/03/20 1418 08/03/20 1555 08/04/20 0346 08/05/20 0500  PLT 754* 747* 627* 491* 587*    Underweight  Body mass index is 17.19 kg/m.  -Consult dietitian       DVT prophylaxis:  SCDs Start: 08/03/20 1754  Code Status: Full code Family Communication: Patient and/or RN. Available if any question.  Level of care: Med-Surg Status is: Inpatient  Remains inpatient appropriate because:Ongoing diagnostic testing needed not appropriate for outpatient work up, IV treatments appropriate due to intensity of illness or inability to take PO, and Inpatient level of care appropriate due to severity of illness  Dispo: The patient is from: Home              Anticipated d/c is to: Home              Patient currently is not medically stable to d/c.   Difficult to place patient No       Consultants:  Gastroenterology   Sch Meds:  Scheduled Meds:  sodium chloride   Intravenous Once   methylPREDNISolone (SOLU-MEDROL) injection  20 mg Intravenous [id number]   pantoprazole  40 mg Oral Q0600   sodium chloride flush  3 mL Intravenous Q12H   Continuous Infusions:  sodium chloride     sodium chloride 100 mL/hr at 08/05/20 1317   PRN Meds:.sodium chloride, acetaminophen **OR** acetaminophen, diphenhydrAMINE, HYDROcodone-acetaminophen, sodium chloride flush  Antimicrobials: Anti-infectives (From admission, onward)    Start     Dose/Rate Route Frequency Ordered Stop   08/03/20 1430  cefTRIAXone (ROCEPHIN) injection 500 mg        500 mg Intramuscular Once 08/03/20 1425 08/03/20 1439   08/03/20 1430  azithromycin (ZITHROMAX) tablet 1,000 mg         1,000 mg Oral  Once 08/03/20 1425 08/03/20 1438        I have personally reviewed the following labs and images: CBC: Recent Labs  Lab 08/03/20 1013 08/03/20 1418 08/03/20 1555 08/04/20 0346 08/05/20 0500  WBC 13.2* 15.2* 12.5* 13.4* 10.9*  NEUTROABS  --  11.6*  --   --  9.4*  HGB 7.3* 7.5* 6.4* 9.0* 8.5*  HCT 26.3* 27.3* 23.4* 30.2* 29.5*  MCV 65.1* 65.3* 65.2* 66.8* 69.4*  PLT 754* 747* 627* 491* 587*   BMP &GFR Recent Labs  Lab 08/03/20 1013 08/05/20 0500  NA 132* 136  K 4.1 4.1  CL 98 101  CO2 26 25  GLUCOSE 102* 105*  BUN 8 6  CREATININE 1.02 0.79  CALCIUM 9.6 9.0   Estimated Creatinine Clearance: 98.1 mL/min (by C-G formula based on SCr of 0.79 mg/dL). Liver & Pancreas: Recent Labs  Lab 08/03/20 1013  AST 13*  ALT 9  ALKPHOS 47  BILITOT 0.4  PROT 7.1  ALBUMIN 2.7*   Recent Labs  Lab 08/03/20 1013  LIPASE 18   No results for input(s): AMMONIA in the last 168 hours. Diabetic: No results  for input(s): HGBA1C in the last 72 hours. No results for input(s): GLUCAP in the last 168 hours. Cardiac Enzymes: No results for input(s): CKTOTAL, CKMB, CKMBINDEX, TROPONINI in the last 168 hours. No results for input(s): PROBNP in the last 8760 hours. Coagulation Profile: No results for input(s): INR, PROTIME in the last 168 hours. Thyroid Function Tests: No results for input(s): TSH, T4TOTAL, FREET4, T3FREE, THYROIDAB in the last 72 hours. Lipid Profile: No results for input(s): CHOL, HDL, LDLCALC, TRIG, CHOLHDL, LDLDIRECT in the last 72 hours. Anemia Panel: Recent Labs    08/03/20 1248  VITAMINB12 343  FOLATE 16.8  FERRITIN 10*  TIBC 410  IRON 9*  RETICCTPCT 1.7   Urine analysis:    Component Value Date/Time   COLORURINE YELLOW 08/03/2020 1417   APPEARANCEUR CLEAR 08/03/2020 1417   LABSPEC >1.046 (H) 08/03/2020 1417   PHURINE 5.0 08/03/2020 1417   GLUCOSEU NEGATIVE 08/03/2020 1417   HGBUR NEGATIVE 08/03/2020 1417   BILIRUBINUR NEGATIVE  08/03/2020 1417   KETONESUR 20 (A) 08/03/2020 1417   PROTEINUR NEGATIVE 08/03/2020 1417   NITRITE NEGATIVE 08/03/2020 1417   LEUKOCYTESUR NEGATIVE 08/03/2020 1417   Sepsis Labs: Invalid input(s): PROCALCITONIN, [place]  Microbiology: Recent Results (from the past 240 hour(s))  SARS CORONAVIRUS 2 (TAT 6-24 HRS) Nasopharyngeal Nasopharyngeal Swab     Status: None   Collection Time: 08/03/20  3:55 PM   Specimen: Nasopharyngeal Swab  Result Value Ref Range Status   SARS Coronavirus 2 NEGATIVE NEGATIVE Final    Comment: (NOTE) SARS-CoV-2 target nucleic acids are NOT DETECTED.  The SARS-CoV-2 RNA is generally detectable in upper and lower respiratory specimens during the acute phase of infection. Negative results do not preclude SARS-CoV-2 infection, do not rule out co-infections with other pathogens, and should not be used as the sole basis for treatment or other patient management decisions. Negative results must be combined with clinical observations, patient history, and epidemiological information. The expected result is Negative.  Fact Sheet for Patients: SugarRoll.be  Fact Sheet for Healthcare Providers: https://www.woods-mathews.com/  This test is not yet approved or cleared by the [place] FDA and  has been authorized for detection and/or diagnosis of SARS-CoV-2 by FDA under an Emergency Use Authorization (EUA). This EUA will remain  in effect (meaning this test can be used) for the duration of the COVID-19 declaration under Se ction 564(b)(1) of the Act, 21 U.S.C. section 360bbb-3(b)(1), unless the authorization is terminated or revoked sooner.  Performed at [facility], [place] [address] [place], [place] [address]     Radiology Studies: No results found.    Gerrica Cygan T. [place]  If 7PM-7AM, please contact night-coverage [email or web address] 08/05/2020, 1:57 PM

## 2020-08-05 NOTE — Plan of Care (Signed)
  Problem: Coping: Goal: Level of anxiety will decrease Outcome: Progressing   Problem: Pain Managment: Goal: General experience of comfort will improve Outcome: Progressing   Problem: Bowel/Gastric: Goal: Will show no signs and symptoms of gastrointestinal bleeding Outcome: Progressing

## 2020-08-05 NOTE — Progress Notes (Signed)
   08/05/20 1146  Pain Assessment  Pain Scale 0-10  Pain Score 0  Provider Notification  Provider Name/Title Dr. Deeann Dowse  Date Provider Notified 08/05/20  Time Provider Notified 1145  Notification Type Page  Notification Reason Other (Comment) (Med reaction)  Provider response See new orders  Date of Provider Response 08/05/20  Time of Provider Response 1155

## 2020-08-05 NOTE — Plan of Care (Signed)
  Problem: Education: Goal: Knowledge of General Education information will improve Description: Including pain rating scale, medication(s)/side effects and non-pharmacologic comfort measures Outcome: Completed/Met   Problem: Health Behavior/Discharge Planning: Goal: Ability to manage health-related needs will improve Outcome: Completed/Met   Problem: Activity: Goal: Risk for activity intolerance will decrease Outcome: Completed/Met

## 2020-08-05 NOTE — Progress Notes (Signed)
Gastroenterology Inpatient Follow-up Note   PATIENT IDENTIFICATION  Isaiah Carpenter is a 25 y.o. male without significant PMH other than iron deficiency NOS.  Patient hospitalized for work-up of abnormal CT findings, rectal bleeding with working diagnosis of possible IBD. Hospital Day: 4  SUBJECTIVE  The patient is seen and evaluated at bedside today.  His mother is with him as well whom I had spoken with after the procedure with his permission.  The patient is feeling slightly different now that he is on the steroids.  He feels a little bit less discomfort after using the restroom.  Still having rectal bleeding and rectal tenesmus.  Was able to get a little bit of rest but still having nocturnal bowel movements.  No fevers or chills.  Abdominal discomfort still persisting.   OBJECTIVE  Scheduled Inpatient Medications:   sodium chloride   Intravenous Once   methylPREDNISolone (SOLU-MEDROL) injection  20 mg Intravenous [id number]   pantoprazole  40 mg Oral Q0600   sodium chloride flush  3 mL Intravenous Q12H   Continuous Inpatient Infusions:   sodium chloride     sodium chloride 100 mL/hr at 08/05/20 0746   ferumoxytol     PRN Inpatient Medications: sodium chloride, acetaminophen **OR** acetaminophen, HYDROcodone-acetaminophen, sodium chloride flush   Physical Examination  Temp:  [98 F (36.7 C)-99.2 F (37.3 C)] 98.3 F (36.8 C) (06/15 0937) Pulse Rate:  [59-104] 65 (06/15 0937) Resp:  [12-20] 17 (06/15 0937) BP: (79-128)/(37-80) 128/71 (06/15 0937) SpO2:  [96 %-100 %] 100 % (06/15 0937) Temp (24hrs), Avg:98.7 F (37.1 C), Min:98 F (36.7 C), Max:99.2 F (37.3 C)  Weight: 48.3 kg GEN: NAD, appears stated age, doesn't appear chronically ill, mother at bedside PSYCH: Cooperative, without pressured speech EYE: Conjunctivae pink, sclerae anicteric ENT: MMM CV: Nontachycardic RESP: No audible wheezing GI: NABS, soft, tenderness to palpation in left lower quadrant and  periumbilical region, mild volitional guarding present, no rebound MSK/EXT: No lower extremity edema SKIN: No jaundice, no spider angiomata, no concerning rashes NEURO:  Alert & Oriented x 3, no focal deficits   Review of Data   Laboratory Studies   Recent Labs  Lab 08/05/20 0500  NA 136  K 4.1  CL 101  CO2 25  BUN 6  CREATININE 0.79  GLUCOSE 105*  CALCIUM 9.0   Recent Labs  Lab 08/03/20 1013  AST 13*  ALT 9  ALKPHOS 47    Recent Labs  Lab 08/03/20 1555 08/04/20 0346 08/05/20 0500  WBC 12.5* 13.4* 10.9*  HGB 6.4* 9.0* 8.5*  HCT 23.4* 30.2* 29.5*  PLT 627* 491* 587*   No results for input(s): APTT, INR in the last 168 hours.   Imaging Studies  CT ABDOMEN PELVIS W CONTRAST  Result Date: 08/03/2020 CLINICAL DATA:  Right lower quadrant abdominal pain, 2 months of constipation with intermittent urgency EXAM: CT ABDOMEN AND PELVIS WITH CONTRAST TECHNIQUE: Multidetector CT imaging of the abdomen and pelvis was performed using the standard protocol following bolus administration of intravenous contrast. CONTRAST:  150m OMNIPAQUE IOHEXOL 300 MG/ML  SOLN COMPARISON:  None. FINDINGS: Lower chest: No acute abnormality. Normal size heart. No significant pericardial effusion/thickening. Hepatobiliary: No suspicious hepatic lesion. Cholelithiasis without findings of acute cholecystitis. No biliary ductal dilation. Pancreas: Within normal limits. Spleen: Within normal limits. Adrenals/Urinary Tract: Adrenal glands are unremarkable. Kidneys are normal, without renal calculi, focal lesion, or hydronephrosis. Bladder is unremarkable. Stomach/Bowel: Symmetric wall thickening involving the rectum extending to the level of the mid sigmoid  colon for instance on coronal image 54 and axial image 69. Large volume of formed stool throughout the remainder of the colon. The appendix is grossly unremarkable. The terminal ileum is unremarkable. No pathologic dilation of small bowel. Stomach is grossly  unremarkable. Vascular/Lymphatic: No significant vascular findings are present. Prominent mesorectal lymph nodes measuring up to 5 mm on image 65/3, favored reactive. No pathologically enlarged abdominal or pelvic lymph nodes. Reproductive: Prostate is unremarkable. Other: No abdominopelvic ascites. Musculoskeletal: No acute or significant osseous findings. IMPRESSION: 1. Symmetric wall thickening involving the rectum extending to the level of the mid sigmoid colon with some adjacent inflammatory stranding and prominent mesorectal lymph nodes. Findings most consistent with infectious or inflammatory proctitis/colitis including ulcerative colitis. Gastroenterology consult suggested. 2. Large volume of formed stool throughout the remainder of the colon suggesting constipation. 3. Cholelithiasis without findings of acute cholecystitis. Electronically Signed   By: Dahlia Bailiff MD   On: 08/03/2020 14:02    GI Procedures and Studies  EGD - No gross lesions in esophagus proximally. - LA Grade C erosive esophagitis with no bleeding distally. Biopsied. - Erythematous mucosa in the stomach. Biopsied. - Duodenitis. Biopsied.  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 - Rectal tenderness found on digital rectal exam. - Severe (Mayo Score 3) inflammatory bowel disease. Biopsied. - Stool at the splenic flexure. - Non-bleeding non-thrombosed external and internal hemorrhoids. - Colonoscopy was incomplete due to the amount of solid stool present - suspect that this is a result of the inflammation/congestion/swelling that has led to this not being able to pass.  ASSESSMENT  Isaiah Carpenter is a 25 y.o. malewithout significant PMH other than iron deficiency NOS.  Patient hospitalized for work-up of abnormal CT findings, rectal bleeding with working diagnosis of possible IBD.  The patient is hemodynamically stable.  Clinically he continues to have significant issues.  Based on the endoscopic  findings, I am most concerned about left-sided ulcerative colitis being the diagnosis in question.  He does have longer standing iron deficiency based on laboratories seen in the system so this would go along with a more chronic process.  He also has evidence of esophagitis and gastritis.  All biopsies are pending at this time.  His inflammatory marker (ESR) is quite elevated.  I am going to continue his IV steroids today.  If he continues to have stability and potential improvement we may be able to consider in the next 1 to 2 days transition to oral steroids and taper.  I would like to initiate the patient on oral topical therapies such as ASA or sulfasalazine or Rowasa enemas but he has an NSAID allergy/hypersensitivity.  These other medications are distant cousins and there are individuals that do tolerate ASA products but if we decided to do that I would have the patient initiated while in-house to see and ensure that he does not have a reaction to the medicine at home.  The significance of the inflammation however, suggest that he may end up needing biologic therapy.  I have asked the patient to begin looking at the Crohn's and colitis foundation website to get more information about the possible use of Remicade or Humira or Stelara or vedolizumab if our biopsies confirm chronicity.  Patient will also need to have a colorectal surgery evaluation for a likely perianal condyloma to have further evaluation and treatment of that.  He will continue on PPI therapy for his recent esophagitis/gastritis finding and will need repeat endoscopic evaluation at some point in  the future.  Full colonoscopy will be recommended at some point in the future as well based on how he tolerates medications in the near future.  For now continue steroids.  All patient questions were answered to the best of my ability, and the patient agrees to the aforementioned plan of action with follow-up as indicated.   PLAN/RECOMMENDATIONS   -We will plan to repeat ESR/CRP prior to discharge -Diarrhea Diary to be started by patient while in hospital (# of BMs, blood/mucous, tenesmus/nocturnal bowel movements) Endoscopy/Pathology/Visit Therapeutically - Continue Solu-Medrol 20 mg IV every 8 hour --if patient is doing really well tomorrow we will consider oral steroid taper -Recommend patient be initiated on VTE prophylaxis and be up and out of bed is much as possible since we know IBD patients have increased risk for VTE Diet has been advanced to normal diet Follow-up laboratories ordered for potential biologic therapy need Patient to look at the Crohn's and colitis foundation website to begin reading about potential therapies Holding on mesalamine therapy due to NSAID allergy but may consider trial before discharge Continue PPI twice daily for esophagitis Follow-up EGD in 3 to 4 months Follow-up colonoscopy at some point in the coming months IV iron ordered for this afternoon to replete stores   Please page/call with questions or concerns.   Justice Britain, MD [facility] Office # [phone]    LOS: 2 days  Irving Copas  08/05/2020, 10:57 AM

## 2020-08-06 ENCOUNTER — Telehealth: Payer: Self-pay | Admitting: Gastroenterology

## 2020-08-06 ENCOUNTER — Encounter (HOSPITAL_COMMUNITY): Payer: Self-pay | Admitting: Gastroenterology

## 2020-08-06 DIAGNOSIS — E871 Hypo-osmolality and hyponatremia: Secondary | ICD-10-CM

## 2020-08-06 DIAGNOSIS — D649 Anemia, unspecified: Secondary | ICD-10-CM

## 2020-08-06 LAB — PROTIME-INR
INR: 1.1 (ref 0.8–1.2)
Prothrombin Time: 14.7 seconds (ref 11.4–15.2)

## 2020-08-06 LAB — RENAL FUNCTION PANEL
Albumin: 2.2 g/dL — ABNORMAL LOW (ref 3.5–5.0)
Anion gap: 6 (ref 5–15)
BUN: <5 mg/dL — ABNORMAL LOW (ref 6–20)
CO2: 27 mmol/L (ref 22–32)
Calcium: 9 mg/dL (ref 8.9–10.3)
Chloride: 103 mmol/L (ref 98–111)
Creatinine, Ser: 0.68 mg/dL (ref 0.61–1.24)
GFR, Estimated: >60 mL/min (ref >60)
Glucose, Bld: 146 mg/dL — ABNORMAL HIGH (ref 70–99)
Phosphorus: 3.5 mg/dL (ref 2.5–4.6)
Potassium: 3.8 mmol/L (ref 3.5–5.1)
Sodium: 136 mmol/L (ref 135–145)

## 2020-08-06 LAB — HEPATITIS B SURFACE ANTIBODY, QUANTITATIVE: Hep B S AB Quant (Post): <3.1 m[IU]/mL — ABNORMAL LOW (ref >9.9)

## 2020-08-06 LAB — MAGNESIUM: Magnesium: 2 mg/dL (ref 1.7–2.4)

## 2020-08-06 LAB — VARICELLA ZOSTER ANTIBODY, IGG: Varicella IgG: <135 index — ABNORMAL LOW (ref >165)

## 2020-08-06 LAB — CBC
HCT: 26.6 % — ABNORMAL LOW (ref 39.0–52.0)
Hemoglobin: 7.8 g/dL — ABNORMAL LOW (ref 13.0–17.0)
MCH: 20.5 pg — ABNORMAL LOW (ref 26.0–34.0)
MCHC: 29.3 g/dL — ABNORMAL LOW (ref 30.0–36.0)
MCV: 70 fL — ABNORMAL LOW (ref 80.0–100.0)
Platelets: 541 10*3/uL — ABNORMAL HIGH (ref 150–400)
RBC: 3.8 MIL/uL — ABNORMAL LOW (ref 4.22–5.81)
RDW: 21.3 % — ABNORMAL HIGH (ref 11.5–15.5)
WBC: 12.9 10*3/uL — ABNORMAL HIGH (ref 4.0–10.5)
nRBC: 0 % (ref 0.0–0.2)

## 2020-08-06 LAB — HCV AB W REFLEX TO QUANT PCR: HCV Ab: <0.1 s/co ratio (ref 0.0–0.9)

## 2020-08-06 LAB — HCV INTERPRETATION

## 2020-08-06 MED ORDER — PREDNISONE 20 MG PO TABS: 20.0000 mg | ORAL_TABLET | Freq: Every day | ORAL | Status: DC

## 2020-08-06 MED ORDER — PREDNISONE 20 MG PO TABS: 40.0000 mg | ORAL_TABLET | Freq: Every day | ORAL | Status: DC

## 2020-08-06 MED ORDER — FERROUS SULFATE 325 (65 FE) MG PO TABS
325.0000 mg | ORAL_TABLET | Freq: Every day | ORAL | Status: DC
  Administered 2020-08-06 – 2020-08-07 (×2): 325 mg via ORAL
  Filled 2020-08-06 (×2): qty 1

## 2020-08-06 MED ORDER — PREDNISONE 10 MG PO TABS: 10.0000 mg | ORAL_TABLET | Freq: Every day | ORAL | Status: DC

## 2020-08-06 MED ORDER — PREDNISONE 20 MG PO TABS: 30.0000 mg | ORAL_TABLET | Freq: Every day | ORAL | Status: DC

## 2020-08-06 MED ORDER — PREDNISONE 20 MG PO TABS
40.0000 mg | ORAL_TABLET | Freq: Every day | ORAL | Status: DC
  Administered 2020-08-07: 40 mg via ORAL
  Filled 2020-08-06: qty 2

## 2020-08-06 MED ORDER — PREDNISONE 5 MG PO TABS: 15.0000 mg | ORAL_TABLET | Freq: Every day | ORAL | Status: DC

## 2020-08-06 MED ORDER — PREDNISONE 5 MG PO TABS: 5.0000 mg | ORAL_TABLET | Freq: Every day | ORAL | Status: DC

## 2020-08-06 NOTE — Progress Notes (Signed)
PROGRESS NOTE    Isaiah Carpenter  UXL:[id number] DOB: Sep 09, 1995 DOA: 08/02/2020 PCP: Pcp, No     Brief Narrative:  25 year old WM PMHx of hemorrhoids   Presenting with intermittent lower abdominal pain for 4 months that has progressively gotten worse for few days prior to the presentation.  He also had pain with bowel movement, increased frequency of BM and blood in stool.  He is in monogamous relation with his male partner.  CT abdomen and pelvis showed symmetric wall thickening involving the rectum extending to the level of mid sigmoid colon with some adjacent inflammatory stranding and prominent mesorectal lymph nodes concerning for infectious or inflammatory proctitis/colitis including UC.  Inflammatory markers elevated.  GI consulted.  EGD revealed large grade C erosive esophagitis, erythematous gastric mucosa and duodenitis but no active bleed.  Colonoscopy although incomplete showed perianal condylomata, severe inflammatory bowel disease, nonbleeding and nonthrombosed external and internal hemorrhoids.  Patient was started on systemic steroid for possible UC.  Not a candidate for mesalamine due to NSAID allergy.  GI following.   Subjective: Afebrile overnight, A/O x4.  Patient's only concern is how long he will have to remain in the hospital on IV treatment.   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  Pathology pending. -6/16 per GI continue Solu-Medrol, at some point they will transition over to prednisone prior to discharge.  Dr. Vicente Serene Mansouraty  [facility] to determine exactly when patient should transition over to prednisone. - Counseled patient secondary to NSAID allergies, and not a candidate for ASA or  sulfasalazine or Rowasa. -GI recommends starting hepatitis B vaccine.  Possibly also repeat varicella vaccination.  Will await their final recommendation.   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  Never been vaccinated for HPV. -May need follow-up with general surgery outpatient   Iron deficiency anemia:  -Transfused 2 units PRBC - Per GI iron sulfate 325 mg daily (Patient had some chest tightness and flushing after IV iron initiation: Discontinued)   Leukocytosis:  -Improved.  Likely due to #1 -Monitor.   Vitamin D insufficiency:  -Vitamin D level 20.89. -P.o. vitamin D   Hyponatremia     Thrombocytosis:        DVT prophylaxis: SCD Code Status: Full Family Communication:  Status is: Inpatient    Dispo: The patient is from: Home              Anticipated d/c is to: Home              Anticipated d/c date is: 6/18              Patient currently unstable      Consultants:  GI  Procedures/Significant Events:    I have personally reviewed and interpreted all radiology studies and my findings are as above.  VENTILATOR SETTINGS:    Cultures   Antimicrobials: Anti-infectives (From admission, onward)    Start     Dose/Rate Route Frequency Ordered Stop   08/03/20 1430  cefTRIAXone (ROCEPHIN) injection 500 mg        500 mg Intramuscular Once 08/03/20 1425 08/03/20 1439   08/03/20 1430  azithromycin (ZITHROMAX) tablet 1,000 mg        1,000 mg Oral  Once 08/03/20 1425 08/03/20 1438         Devices    LINES / TUBES:      Continuous Infusions:  sodium chloride     sodium chloride 100 mL/hr at 08/06/20 1522     Objective: Vitals:   08/05/20 1750 08/05/20 2250 08/06/20 0549 08/06/20 1046  BP: 108/66 115/69 110/71 104/82  Pulse: 70 65 60 72  Resp: 17 17 20 17   Temp: 98.7 F (37.1 C) 98 F (36.7 C) 98 F (36.7 C) 98.2 F (36.8 C)  TempSrc:  Oral Oral   SpO2:  98% 97% 100% 100%  Weight:      Height:        Intake/Output Summary (Last 24 hours) at 08/06/2020 1609 Last data filed at 08/06/2020 1522 Gross per 24 hour  Intake 3885.46 ml  Output 0 ml  Net 3885.46 ml   Filed Weights   08/02/20 2341 08/04/20 0137  Weight: 65.8 kg 48.3 kg    Examination:  General: A/O x4, No acute respiratory distress Eyes: negative scleral hemorrhage, negative anisocoria, negative icterus ENT: Negative Runny nose, negative gingival bleeding, Neck:  Negative scars, masses, torticollis, lymphadenopathy, JVD Lungs: Clear to auscultation bilaterally without wheezes or crackles Cardiovascular: Regular rate and rhythm without murmur gallop or rub normal S1 and S2 Abdomen: Positive abdominal pain (significantly improved per patient), nondistended, positive soft, bowel sounds, no rebound, no ascites, no appreciable mass Extremities: No significant cyanosis, clubbing, or edema bilateral lower extremities Skin: Negative rashes, lesions, ulcers Psychiatric:  Negative depression, negative anxiety, negative fatigue, negative mania  Central nervous system:  Cranial nerves II through XII intact, tongue/uvula midline, all extremities muscle strength 5/5, sensation intact throughout, negative dysarthria, negative expressive aphasia, negative receptive aphasia.  .     Data Reviewed: Care during the described time interval was provided by me .  I have reviewed this patient's available data, including medical history, events of note, physical examination, and all test results as part of my evaluation.  CBC: Recent Labs  Lab 08/03/20 1418 08/03/20 1555 08/04/20 0346 08/05/20 0500 08/06/20 0220  WBC 15.2* 12.5* 13.4* 10.9* 12.9*  NEUTROABS 11.6*  --   --  9.4*  --   HGB 7.5* 6.4* 9.0* 8.5* 7.8*  HCT 27.3* 23.4* 30.2* 29.5* 26.6*  MCV 65.3* 65.2* 66.8* 69.4* 70.0*  PLT 747* 627* 491* 587* 541*   Basic Metabolic Panel: Recent Labs  Lab 08/03/20 1013 08/05/20 0500  08/06/20 0220  NA 132* 136 136  K 4.1 4.1 3.8  CL 98 101 103  CO2 26 25 27   GLUCOSE 102* 105* 146*  BUN 8 6 <5*  CREATININE 1.02 0.79 0.68  CALCIUM 9.6 9.0 9.0  MG  --   --  2.0  PHOS  --   --  3.5   GFR: Estimated Creatinine Clearance: 98.1 mL/min (by C-G formula based on SCr of 0.68 mg/dL). Liver Function Tests: Recent Labs  Lab 08/03/20 1013 08/06/20 0220  AST 13*  --   ALT 9  --   ALKPHOS 47  --   BILITOT 0.4  --   PROT 7.1  --   ALBUMIN 2.7* 2.2*   Recent Labs  Lab 08/03/20 1013  LIPASE 18   No results for input(s): AMMONIA in the last 168 hours. Coagulation Profile: Recent Labs  Lab 08/06/20 0220  INR 1.1   Cardiac Enzymes: No results for input(s): CKTOTAL, CKMB, CKMBINDEX, TROPONINI in the last 168 hours. BNP (last 3 results) No results  for input(s): PROBNP in the last 8760 hours. HbA1C: No results for input(s): HGBA1C in the last 72 hours. CBG: No results for input(s): GLUCAP in the last 168 hours. Lipid Profile: No results for input(s): CHOL, HDL, LDLCALC, TRIG, CHOLHDL, LDLDIRECT in the last 72 hours. Thyroid Function Tests: No results for input(s): TSH, T4TOTAL, FREET4, T3FREE, THYROIDAB in the last 72 hours. Anemia Panel: No results for input(s): VITAMINB12, FOLATE, FERRITIN, TIBC, IRON, RETICCTPCT in the last 72 hours. Sepsis Labs: No results for input(s): PROCALCITON, LATICACIDVEN in the last 168 hours.  Recent Results (from the past 240 hour(s))  SARS CORONAVIRUS 2 (TAT 6-24 HRS) Nasopharyngeal Nasopharyngeal Swab     Status: None   Collection Time: 08/03/20  3:55 PM   Specimen: Nasopharyngeal Swab  Result Value Ref Range Status   SARS Coronavirus 2 NEGATIVE NEGATIVE Final    Comment: (NOTE) SARS-CoV-2 target nucleic acids are NOT DETECTED.  The SARS-CoV-2 RNA is generally detectable in upper and lower respiratory specimens during the acute phase of infection. Negative results do not preclude SARS-CoV-2 infection, do not rule  out co-infections with other pathogens, and should not be used as the sole basis for treatment or other patient management decisions. Negative results must be combined with clinical observations, patient history, and epidemiological information. The expected result is Negative.  Fact Sheet for Patients: HairSlick.no  Fact Sheet for Healthcare Providers: quierodirigir.com  This test is not yet approved or cleared by the [place] FDA and  has been authorized for detection and/or diagnosis of SARS-CoV-2 by FDA under an Emergency Use Authorization (EUA). This EUA will remain  in effect (meaning this test can be used) for the duration of the COVID-19 declaration under Se ction 564(b)(1) of the Act, 21 U.S.C. section 360bbb-3(b)(1), unless the authorization is terminated or revoked sooner.  Performed at [facility], [address] [place], [place] [address]          Radiology Studies: No results found.      Scheduled Meds:  sodium chloride   Intravenous Once   ferrous sulfate  325 mg Oral Q breakfast   methylPREDNISolone (SOLU-MEDROL) injection  20 mg Intravenous [id number]   pantoprazole  40 mg Oral Q0600   sodium chloride flush  3 mL Intravenous Q12H   Continuous Infusions:  sodium chloride     sodium chloride 100 mL/hr at 08/06/20 1522     LOS: 3 days    Time spent:40 min    Blimy Napoleon, Roselind Messier, MD [facility]   If 7PM-7AM, please contact night-coverage 08/06/2020, 4:09 PM

## 2020-08-06 NOTE — Progress Notes (Addendum)
Daily Rounding Note  08/06/2020, 11:55 AM  LOS: 3 days   SUBJECTIVE:   Chief complaint: Colitis, bloody stools.  Bloody stool resolved, abdominal pain mostly resolved.  Rectal pain/tenesmus and multiple loose, nonbloody stools continue.  Probably had 8 yesterday and 5 so far today.  Although the stools are no longer bloody, when he wipes he does see blood on the tissue paper.  Overall feeling better.  Tolerating solid food.  No weakness or dizziness. Had chest tightness, SOB within 30 minutes of the Feraheme infusion so that was discontinued.   OBJECTIVE:         Vital signs in last 24 hours:    Temp:  [98 F (36.7 C)-98.7 F (37.1 C)] 98.2 F (36.8 C) (06/16 1046) Pulse Rate:  [60-72] 72 (06/16 1046) Resp:  [17-20] 17 (06/16 1046) BP: (104-115)/(66-82) 104/82 (06/16 1046) SpO2:  [97 %-100 %] 100 % (06/16 1046) Last BM Date: 08/05/20 Filed Weights   08/02/20 2341 08/04/20 0137  Weight: 65.8 kg 48.3 kg   General: Less pale.  Thin.  Comfortable. Heart: RRR. Chest: Clear bilaterally Abdomen: Soft.  Minimal nonfocal tenderness.  No distention.  Active bowel sounds Extremities: No CCE. Neuro/Psych: Alert.  Pleasant.  Calm.  Oriented x3.  No gross deficits.  No tremor  Intake/Output from previous day: 06/15 0701 - 06/16 0700 In: 3730.2 [P.O.:1500; I.V.:2182.4; IV Piggyback:47.9] Out: 0   Intake/Output this shift: Total I/O In: 483.9 [P.O.:300; I.V.:183.9] Out: -   Lab Results: Recent Labs    08/04/20 0346 08/05/20 0500 08/06/20 0220  WBC 13.4* 10.9* 12.9*  HGB 9.0* 8.5* 7.8*  HCT 30.2* 29.5* 26.6*  PLT 491* 587* 541*   BMET Recent Labs    08/05/20 0500 08/06/20 0220  NA 136 136  K 4.1 3.8  CL 101 103  CO2 25 27  GLUCOSE 105* 146*  BUN 6 <5*  CREATININE 0.79 0.68  CALCIUM 9.0 9.0   LFT Recent Labs    08/06/20 0220  ALBUMIN 2.2*   PT/INR Recent Labs    08/06/20 0220  LABPROT 14.7  INR  1.1   Hepatitis Panel Recent Labs    08/05/20 0500  HEPBSAG NON REACTIVE    Studies/Results: No results found.  Scheduled Meds:  sodium chloride   Intravenous Once   methylPREDNISolone (SOLU-MEDROL) injection  20 mg Intravenous [id number]   pantoprazole  40 mg Oral Q0600   sodium chloride flush  3 mL Intravenous Q12H   Continuous Infusions:  sodium chloride     sodium chloride 100 mL/hr at 08/06/20 0759   PRN Meds:.sodium chloride, acetaminophen **OR** acetaminophen, diphenhydrAMINE, HYDROcodone-acetaminophen, hydrocortisone, sodium chloride flush  ASSESMENT:      Colitis, IBD.  Rectal bleeding.  Mayo score 3/severe IBD.  Awaiting biopsy. 08/05/2019 colonoscopy incomplete due to solid stool at splenic flexure. CRP 9.8. Sed rate 27.  Hep B surface Ab, Hep B core Ab, Hep B-post <3.1 consistent w no immunity. HCV Ab negative.  Hep A Ab total reactive.  Varicella < 135, immunity is > 165.   TPMT assay pending.  CMV DNA pending.  Surgical pathology pending.  Fecal calprotectin pending.  QuantiFERON gold TB pndg.   Pt says he was vaxed for chicken pox.   Day 2.5 Solumedrol.   Bloody stool resolved, abdominal pain mostly resolved.  Rectal pain/tenesmus and multiple bloody stools daily continue.  Erosive esophagitis, gastritis, duodenitis per 6/14 EGD.  Protonix 40 mg po daily in place.  IDA.  Hgb 6.4 >> 2 PRBCs >> 7.8.  did not tolerate Feraheme 6/15.       Perianal condylomata.     Vitamin D deficiency.   PLAN     Would start Hep B vaccines.  ? Does he need repeat varicella vaccination?      Continue IV Solu-Medrol for now.  Decision about converting to prednisone and discharged home per Dr. Meridee Score.     Added iron sulfate 325 mg p.o. daily, start today.  Make sure he tolerates this.  Unlikely there will be adverse effects as was the case with Feraheme, but observe for any reactions.       Jennye Moccasin  08/06/2020, 11:55 AM Phone [phone]

## 2020-08-06 NOTE — Plan of Care (Signed)
  Problem: Coping: Goal: Level of anxiety will decrease Outcome: Progressing   Problem: Elimination: Goal: Will not experience complications related to bowel motility Outcome: Progressing   Problem: Pain Managment: Goal: General experience of comfort will improve Outcome: Progressing   

## 2020-08-07 ENCOUNTER — Other Ambulatory Visit (HOSPITAL_COMMUNITY): Payer: Self-pay

## 2020-08-07 DIAGNOSIS — E559 Vitamin D deficiency, unspecified: Secondary | ICD-10-CM

## 2020-08-07 DIAGNOSIS — A630 Anogenital (venereal) warts: Secondary | ICD-10-CM | POA: Diagnosis present

## 2020-08-07 DIAGNOSIS — K2980 Duodenitis without bleeding: Secondary | ICD-10-CM | POA: Diagnosis present

## 2020-08-07 DIAGNOSIS — K209 Esophagitis, unspecified without bleeding: Secondary | ICD-10-CM | POA: Diagnosis present

## 2020-08-07 DIAGNOSIS — D75839 Thrombocytosis, unspecified: Secondary | ICD-10-CM

## 2020-08-07 LAB — SURGICAL PATHOLOGY

## 2020-08-07 LAB — CBC WITH DIFFERENTIAL/PLATELET
Abs Immature Granulocytes: 0.28 10*3/uL — ABNORMAL HIGH (ref 0.00–0.07)
Basophils Absolute: 0 10*3/uL (ref 0.0–0.1)
Basophils Relative: 0 %
Eosinophils Absolute: 0 10*3/uL (ref 0.0–0.5)
Eosinophils Relative: 0 %
HCT: 30.2 % — ABNORMAL LOW (ref 39.0–52.0)
Hemoglobin: 8.6 g/dL — ABNORMAL LOW (ref 13.0–17.0)
Immature Granulocytes: 2 %
Lymphocytes Relative: 16 %
Lymphs Abs: 2.1 10*3/uL (ref 0.7–4.0)
MCH: 20 pg — ABNORMAL LOW (ref 26.0–34.0)
MCHC: 28.5 g/dL — ABNORMAL LOW (ref 30.0–36.0)
MCV: 70.4 fL — ABNORMAL LOW (ref 80.0–100.0)
Monocytes Absolute: 1.2 10*3/uL — ABNORMAL HIGH (ref 0.1–1.0)
Monocytes Relative: 9 %
Neutro Abs: 9.8 10*3/uL — ABNORMAL HIGH (ref 1.7–7.7)
Neutrophils Relative %: 73 %
Platelets: 646 10*3/uL — ABNORMAL HIGH (ref 150–400)
RBC: 4.29 MIL/uL (ref 4.22–5.81)
RDW: 22.3 % — ABNORMAL HIGH (ref 11.5–15.5)
WBC: 13.4 10*3/uL — ABNORMAL HIGH (ref 4.0–10.5)
nRBC: 0 % (ref 0.0–0.2)

## 2020-08-07 LAB — COMPREHENSIVE METABOLIC PANEL
ALT: 9 U/L (ref 0–44)
AST: 9 U/L — ABNORMAL LOW (ref 15–41)
Albumin: 2.5 g/dL — ABNORMAL LOW (ref 3.5–5.0)
Alkaline Phosphatase: 44 U/L (ref 38–126)
Anion gap: 5 (ref 5–15)
BUN: <5 mg/dL — ABNORMAL LOW (ref 6–20)
CO2: 28 mmol/L (ref 22–32)
Calcium: 9.3 mg/dL (ref 8.9–10.3)
Chloride: 103 mmol/L (ref 98–111)
Creatinine, Ser: 0.76 mg/dL (ref 0.61–1.24)
GFR, Estimated: >60 mL/min (ref >60)
Glucose, Bld: 115 mg/dL — ABNORMAL HIGH (ref 70–99)
Potassium: 3.3 mmol/L — ABNORMAL LOW (ref 3.5–5.1)
Sodium: 136 mmol/L (ref 135–145)
Total Bilirubin: 0.5 mg/dL (ref 0.3–1.2)
Total Protein: 6.2 g/dL — ABNORMAL LOW (ref 6.5–8.1)

## 2020-08-07 LAB — PHOSPHORUS: Phosphorus: 2 mg/dL — ABNORMAL LOW (ref 2.5–4.6)

## 2020-08-07 LAB — MAGNESIUM: Magnesium: 1.7 mg/dL (ref 1.7–2.4)

## 2020-08-07 LAB — CMV DNA, QUANTITATIVE, PCR
CMV DNA Quant: NEGATIVE IU/mL
Log10 CMV Qn DNA Pl: UNDETERMINED log10 IU/mL

## 2020-08-07 MED ORDER — PREDNISONE 10 MG PO TABS
10.0000 mg | ORAL_TABLET | Freq: Every day | ORAL | 0 refills | Status: DC
  Filled 2020-08-07: qty 14, 14d supply, fill #0

## 2020-08-07 MED ORDER — PREDNISONE 20 MG PO TABS
20.0000 mg | ORAL_TABLET | Freq: Every day | ORAL | 0 refills | Status: DC
  Filled 2020-08-07: qty 14, 14d supply, fill #0

## 2020-08-07 MED ORDER — FERROUS SULFATE 325 (65 FE) MG PO TABS
325.0000 mg | ORAL_TABLET | Freq: Every day | ORAL | 0 refills | Status: DC
  Filled 2020-08-07: qty 30, 30d supply, fill #0

## 2020-08-07 MED ORDER — DIPHENHYDRAMINE HCL 25 MG PO TABS
25.0000 mg | ORAL_TABLET | Freq: Four times a day (QID) | ORAL | 0 refills | Status: DC | PRN
  Filled 2020-08-07: qty 30, 8d supply, fill #0

## 2020-08-07 MED ORDER — PREDNISONE 5 MG PO TABS
15.0000 mg | ORAL_TABLET | Freq: Every day | ORAL | 0 refills | Status: DC
  Filled 2020-08-07 (×2): qty 42, 14d supply, fill #0

## 2020-08-07 MED ORDER — HYDROCORTISONE ACETATE 25 MG RE SUPP
25.0000 mg | Freq: Two times a day (BID) | RECTAL | 0 refills | Status: DC | PRN
  Filled 2020-08-07: qty 14, 7d supply, fill #0

## 2020-08-07 MED ORDER — PREDNISONE 10 MG PO TABS
30.0000 mg | ORAL_TABLET | Freq: Every day | ORAL | 0 refills | Status: DC
  Filled 2020-08-07: qty 42, 14d supply, fill #0

## 2020-08-07 MED ORDER — PREDNISONE 20 MG PO TABS
ORAL_TABLET | ORAL | 0 refills | Status: DC
  Filled 2020-08-07: qty 63, 30d supply, fill #0

## 2020-08-07 MED ORDER — PREDNISONE 5 MG PO TABS
5.0000 mg | ORAL_TABLET | Freq: Every day | ORAL | 0 refills | Status: DC
  Filled 2020-08-07: qty 14, 14d supply, fill #0

## 2020-08-07 MED ORDER — PANTOPRAZOLE SODIUM 40 MG PO TBEC
40.0000 mg | DELAYED_RELEASE_TABLET | Freq: Every day | ORAL | 0 refills | Status: DC
  Filled 2020-08-07: qty 30, 30d supply, fill #0

## 2020-08-07 NOTE — TOC Progression Note (Signed)
Transition of Care [facility]) - Progression Note    Patient Details  Name: Isaiah Carpenter MRN: [id number] Date of Birth: 1995/10/28  Transition of Care [facility]) CM/SW Contact  Beckie Busing, RN Phone [phone]  08/07/2020, 10:51 AM  Clinical Narrative:    MATCH completed to provide patient with 30 day supply of meds from [facility] pharmacy.        Expected Discharge Plan and Services           Expected Discharge Date: 08/07/20                                     Social Determinants of Health (SDOH) Interventions    Readmission Risk Interventions No flowsheet data found.

## 2020-08-07 NOTE — Progress Notes (Signed)
DISCHARGE NOTE HOME Isaiah Carpenter to be discharged Home per MD order. Discussed prescriptions and follow up appointments with the patient. Prescriptions given to patient; medication list explained in detail. Patient verbalized understanding.  Skin clean, dry and intact without evidence of skin break down, no evidence of skin tears noted. IV catheter discontinued intact. Site without signs and symptoms of complications. Dressing and pressure applied. Pt denies pain at the site currently. No complaints noted.  Patient free of lines, drains, and wounds.   An After Visit Summary (AVS) was printed and given to the patient. Patient escorted via wheelchair, and discharged home via private auto.  Grisela Mesch S Ikeem Cleckler, RN

## 2020-08-07 NOTE — Plan of Care (Signed)
  Problem: Coping: Goal: Level of anxiety will decrease Outcome: Completed/Met   Problem: Elimination: Goal: Will not experience complications related to urinary retention Outcome: Completed/Met   Problem: Pain Managment: Goal: General experience of comfort will improve Outcome: Completed/Met   Problem: Safety: Goal: Ability to remain free from injury will improve Outcome: Completed/Met   Problem: Skin Integrity: Goal: Risk for impaired skin integrity will decrease Outcome: Completed/Met   Problem: Fluid Volume: Goal: Will show no signs and symptoms of excessive bleeding Outcome: Completed/Met   Problem: Clinical Measurements: Goal: Complications related to the disease process, condition or treatment will be avoided or minimized Outcome: Completed/Met

## 2020-08-07 NOTE — Discharge Summary (Signed)
Physician Discharge Summary  Chelsea Ausyler Beaudin AOZ:[id number] DOB: 1995-05-21 DOA: 08/02/2020  PCP: Pcp, No  Admit date: 08/02/2020 Discharge date: 08/07/2020  Time spent: 35 minutes  Recommendations for Outpatient Follow-up:    Covid vaccination; vaccinated 2/3     Proctitis/possible IBD -3/14 s/p EGD and colonoscopy: Pathology pending, fecal calprotectin and other speciality labs -IV nonreactive.  Pathology pending. -6/16 per GI continue Solu-Medrol, at some point they will transition over to prednisone prior to discharge.  Dr. Vicente SereneGabriel Mansouraty  [facility] to determine exactly when patient should transition over to prednisone. - Counseled patient secondary to NSAID allergies, and not a candidate for ASA or sulfasalazine or Rowasa. -GI recommends the following: Prednisone 40 mg today daily and continue this for 2 weeks and then decrease to 30 mg for 2 weeks and then to 20 mg for 2 weeks.   -Dr. Vicente SereneGabriel Mansouraty  [facility] will work on arranging a follow-up in clinic with myself or one of the APP's in the next 2 to 3 weeks.     Esophagitis/gastritis/duodenitis- -Continue PPI   Condylomata acuminata- -Patient is monogamous with his male partner.  Never been vaccinated for HPV. -May need follow-up with general surgery outpatient.  Will allow GI to determine when/if this is required.   Iron deficiency anemia:  -6/13 transfused 2 units PRBC - Per GI iron sulfate 325 mg daily (Patient had some chest tightness and flushing after IV iron initiation: Discontinued)   Leukocytosis:  -Improved.  Likely due to #1 -Monitor.   Vitamin D insufficiency:  -Vitamin D level 20.89. -Resolved   Hyponatremia -Resolved   Thrombocytosis:  Lab Results  Component Value Date   PLT 541 (H) 08/06/2020   PLT 587 (H) 08/05/2020   PLT 491 (H) 08/04/2020   PLT 627 (H) 08/03/2020   PLT 747 (H) 08/03/2020  -Trending down.   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  He also had pain with bowel movement, increased frequency of BM and blood in stool.  He is in monogamous relation with his male partner.  CT abdomen and pelvis showed symmetric wall thickening involving the rectum extending to the level of mid sigmoid colon with some adjacent inflammatory stranding and prominent mesorectal lymph nodes concerning for infectious or inflammatory proctitis/colitis including UC.  Inflammatory markers elevated.  GI consulted.  EGD revealed large grade C erosive esophagitis, erythematous gastric mucosa and duodenitis but no active bleed.  Colonoscopy although incomplete showed perianal condylomata, severe inflammatory bowel disease, nonbleeding and nonthrombosed external and internal hemorrhoids.  Patient was started on systemic steroid for possible UC.  Not a candidate for mesalamine due to NSAID allergy.  GI following.  Hospital Course:  See above  Procedures: 6/14 Colonoscopy:- Perianal condylomata found on perianal exam.                           -  Rectal tenderness found on digital rectal exam.                           - Severe (Mayo Score 3) inflammatory bowel disease.                           Biopsied.                           - Stool at the splenic flexure.                           - Non-bleeding non-thrombosed external and internal                           hemorrhoids.                           - Colonoscopy was  incomplete due to the amount of                           solid stool present - suspect that this is a result                           of the inflammation/congestion/swelling that has                           led to this not being able to pass. 6/14 EGD - No gross lesions in esophagus.                           - LA Grade C erosive esophagitis with no bleeding.                           Biopsied.                           - Erythematous mucosa in the stomach. Biopsied.                           - Duodenitis. Biopsied.  Consultations: Dr. Vicente Serene Mansouraty  [facility]  Cultures  6/13 SARS coronavirus negative 6/15 QuantiFERON gold pending 6/15 hepatitis A antibody reactive 6/15 varicella<135 6/15 hepatitis B surface antigen nonreactive, hepatitis B core antibody nonreactive 6/15 hepatitis C antibody negative   Antibiotics Anti-infectives (From admission, onward)    Start     Ordered Stop   08/03/20 1430  cefTRIAXone (ROCEPHIN) injection 500 mg        08/03/20 1425 08/03/20 1439   08/03/20 1430  azithromycin (ZITHROMAX) tablet 1,000 mg        08/03/20 1425 08/03/20 1438         Discharge Exam: Vitals:   08/06/20 1046 08/06/20 1718 08/06/20 2027 08/07/20 0544  BP: 104/82 118/77 121/82 107/67  Pulse: 72 60 73 (!) 52  Resp: Temp: 98.2 F (36.8 C) 98.3 F (36.8 C) 98.6 F (37 C) 97.8 F (36.6 C)  TempSrc:  Oral Oral   SpO2: 100% 100% 100% 100%  Weight:      Height:        General: A/O x4, No acute respiratory distress Eyes: negative scleral hemorrhage, negative anisocoria, negative icterus ENT: Negative Runny nose, negative gingival bleeding, Neck:  Negative scars, masses, torticollis, lymphadenopathy, JVD Lungs: Clear to auscultation bilaterally without wheezes or crackles Cardiovascular: Regular rate and rhythm without murmur gallop or rub normal S1 and S2 Abdomen: Positive abdominal pain (significantly improved per patient), nondistended,  positive soft, bowel sounds, no rebound, no ascites, no appreciable mass  Discharge Instructions   Allergies as of 08/07/2020       Reactions   Aleve [naproxen Sodium] Anaphylaxis   Feraheme [ferumoxytol] Shortness Of Breath   Patient had hot flushes and tight ness in ches within 5 minutes of iv infusion   Ibuprofen Anaphylaxis, Swelling        Medication List     STOP taking these medications    fluticasone 50 MCG/ACT nasal spray Commonly known as: FLONASE   glycerin adult 2 g suppository   senna-docusate 8.6-50 MG tablet Commonly known as: Senokot-S       TAKE these medications    acetaminophen 500 MG tablet Commonly known as: TYLENOL Take 500-1,000 mg by mouth every 8 (eight) hours as needed for mild pain (or headaches).   diphenhydrAMINE 25 mg capsule Commonly known as: BENADRYL Take 1 capsule (25 mg total) by mouth every 6 (six) hours as needed for allergies or itching.   ferrous sulfate 325 (65 FE) MG tablet Take 1 tablet (325 mg total) by mouth daily with breakfast. Start taking on: August 08, 2020   hydrocortisone 25 MG suppository Commonly known as: ANUSOL-HC Place 1 suppository (25 mg total) rectally 2 (two) times daily as needed for hemorrhoids or anal itching.   pantoprazole 40 MG tablet Commonly known as: PROTONIX Take 1 tablet (40 mg total) by mouth daily at 6 (six) AM. Start taking on: August 08, 2020   predniSONE 20 MG tablet Commonly known as: DELTASONE Take 2 tablets (40 mg total) by mouth daily with breakfast. Start taking on: August 08, 2020   predniSONE 10 MG tablet Commonly known as: DELTASONE Take 3 tablets (30 mg total) by mouth daily with breakfast. Start taking on: August 21, 2020   predniSONE 20 MG tablet Commonly known as: DELTASONE Take 1 tablet (20 mg total) by mouth daily with breakfast. Start taking on: September 04, 2020   predniSONE 5 MG tablet Commonly known as: DELTASONE Take 3 tablets (15 mg total) by mouth daily with  breakfast. Start taking on: September 18, 2020   predniSONE 10 MG tablet Commonly known as: DELTASONE Take 1 tablet (10 mg total) by mouth daily with breakfast. Start taking on: October 02, 2020   predniSONE 5 MG tablet Commonly known as: DELTASONE Take 1 tablet (5 mg total) by mouth daily with breakfast. Start taking on: October 16, 2020       Allergies  Allergen Reactions   Aleve [Naproxen Sodium] Anaphylaxis   Feraheme [Ferumoxytol] Shortness Of Breath    Patient had hot flushes and tight ness in ches within 5 minutes of iv infusion   Ibuprofen Anaphylaxis and Swelling      The results of significant diagnostics from this hospitalization (including imaging, microbiology, ancillary and laboratory) are listed below for reference.    Significant Diagnostic Studies: CT ABDOMEN PELVIS W CONTRAST  Result Date: 08/03/2020 CLINICAL DATA:  Right lower quadrant abdominal pain, 2 months of constipation with intermittent urgency EXAM: CT ABDOMEN AND  PELVIS WITH CONTRAST TECHNIQUE: Multidetector CT imaging of the abdomen and pelvis was performed using the standard protocol following bolus administration of intravenous contrast. CONTRAST:  OMNIPAQUE IOHEXOL 300 MG/ML  SOLN COMPARISON:  None. FINDINGS: Lower chest: No acute abnormality. Normal size heart. No significant pericardial effusion/thickening. Hepatobiliary: No suspicious hepatic lesion. Cholelithiasis without findings of acute cholecystitis. No biliary ductal dilation. Pancreas: Within normal limits. Spleen: Within normal limits. Adrenals/Urinary Tract: Adrenal glands are unremarkable. Kidneys are normal, without renal calculi, focal lesion, or hydronephrosis. Bladder is unremarkable. Stomach/Bowel: Symmetric wall thickening involving the rectum extending to the level of the mid sigmoid colon for instance on coronal image 54 and axial image 69. Large volume of formed stool throughout the remainder of the colon. The appendix is grossly  unremarkable. The terminal ileum is unremarkable. No pathologic dilation of small bowel. Stomach is grossly unremarkable. Vascular/Lymphatic: No significant vascular findings are present. Prominent mesorectal lymph nodes measuring up to 5 mm on image 65/3, favored reactive. No pathologically enlarged abdominal or pelvic lymph nodes. Reproductive: Prostate is unremarkable. Other: No abdominopelvic ascites. Musculoskeletal: No acute or significant osseous findings. IMPRESSION: 1. Symmetric wall thickening involving the rectum extending to the level of the mid sigmoid colon with some adjacent inflammatory stranding and prominent mesorectal lymph nodes. Findings most consistent with infectious or inflammatory proctitis/colitis including ulcerative colitis. Gastroenterology consult suggested. 2. Large volume of formed stool throughout the remainder of the colon suggesting constipation. 3. Cholelithiasis without findings of acute cholecystitis. Electronically Signed   By: Maudry Mayhew MD   On: 08/03/2020 14:02    Microbiology: Recent Results (from the past 240 hour(s))  SARS CORONAVIRUS 2 (TAT 6-24 HRS) Nasopharyngeal Nasopharyngeal Swab     Status: None   Collection Time: 08/03/20  3:55 PM   Specimen: Nasopharyngeal Swab  Result Value Ref Range Status   SARS Coronavirus 2 NEGATIVE NEGATIVE Final    Comment: (NOTE) SARS-CoV-2 target nucleic acids are NOT DETECTED.  The SARS-CoV-2 RNA is generally detectable in upper and lower respiratory specimens during the acute phase of infection. Negative results do not preclude SARS-CoV-2 infection, do not rule out co-infections with other pathogens, and should not be used as the sole basis for treatment or other patient management decisions. Negative results must be combined with clinical observations, patient history, and epidemiological information. The expected result is Negative.  Fact Sheet for Patients: HairSlick.no  Fact  Sheet for Healthcare Providers: quierodirigir.com  This test is not yet approved or cleared by the [place] FDA and  has been authorized for detection and/or diagnosis of SARS-CoV-2 by FDA under an Emergency Use Authorization (EUA). This EUA will remain  in effect (meaning this test can be used) for the duration of the COVID-19 declaration under Se ction 564(b)(1) of the Act, 21 U.S.C. section 360bbb-3(b)(1), unless the authorization is terminated or revoked sooner.  Performed at [facility], [address] [place], [place] [address]      Labs: Basic Metabolic Panel: Recent Labs  Lab 08/03/20 1013 08/05/20 0500 08/06/20 0220  NA 132* 136 136  K 4.1 4.1 3.8  CL 98 101 103  CO2 26 25 27   GLUCOSE 102* 105* 146*  BUN 8 6 <5*  CREATININE 1.02 0.79 0.68  CALCIUM 9.6 9.0 9.0  MG  --   --  2.0  PHOS  --   --  3.5   Liver Function Tests: Recent Labs  Lab 08/03/20 1013 08/06/20 0220  AST 13*  --  ALT 9  --   ALKPHOS 47  --   BILITOT 0.4  --   PROT 7.1  --   ALBUMIN 2.7* 2.2*   Recent Labs  Lab 08/03/20 1013  LIPASE 18   No results for input(s): AMMONIA in the last 168 hours. CBC: Recent Labs  Lab 08/03/20 1418 08/03/20 1555 08/04/20 0346 08/05/20 0500 08/06/20 0220  WBC 15.2* 12.5* 13.4* 10.9* 12.9*  NEUTROABS 11.6*  --   --  9.4*  --   HGB 7.5* 6.4* 9.0* 8.5* 7.8*  HCT 27.3* 23.4* 30.2* 29.5* 26.6*  MCV 65.3* 65.2* 66.8* 69.4* 70.0*  PLT 747* 627* 491* 587* 541*   Cardiac Enzymes: No results for input(s): CKTOTAL, CKMB, CKMBINDEX, TROPONINI in the last 168 hours. BNP: BNP (last 3 results) No results for input(s): BNP in the last 8760 hours.  ProBNP (last 3 results) No results for input(s): PROBNP in the last 8760 hours.  CBG: No results for input(s): GLUCAP in the last 168 hours.     Signed:  Carolyne Littles, MD [facility]

## 2020-08-07 NOTE — Progress Notes (Addendum)
Gastroenterology Inpatient Follow-up Note   PATIENT IDENTIFICATION  Isaiah Carpenter is a 25 y.o. male without a significant prior PMH who likely now has diagnosis of left-sided colitis (pending biopsies). Hospital Day: 6  SUBJECTIVE  The patient had 1 nocturnal bowel movement overnight.  Bloody bowel movements are still present but not to the degree of where they were previously.  When he wipes he is still noticing some blood.  Overall feels well and has had breakfast.  He has not taken his oral prednisone as of yet.  Abdominal discomfort improved tremendously from where things were before but still present somewhat.  No significant nausea at this time.  He is wondering if he will be able to be home this weekend.     OBJECTIVE  Scheduled Inpatient Medications:   sodium chloride   Intravenous Once   ferrous sulfate  325 mg Oral Q breakfast   pantoprazole  40 mg Oral Q0600   predniSONE  40 mg Oral Q breakfast   Followed by   Melene Muller ON 08/21/2020] predniSONE  30 mg Oral Q breakfast   Followed by   Melene Muller ON 09/04/2020] predniSONE  20 mg Oral Q breakfast   Followed by   Melene Muller ON 09/18/2020] predniSONE  15 mg Oral Q breakfast   Followed by   Melene Muller ON 10/02/2020] predniSONE  10 mg Oral Q breakfast   Followed by   Melene Muller ON 10/16/2020] predniSONE  5 mg Oral Q breakfast   sodium chloride flush  3 mL Intravenous Q12H   Continuous Inpatient Infusions:   sodium chloride     sodium chloride 100 mL/hr at 08/07/20 0825   PRN Inpatient Medications: sodium chloride, acetaminophen **OR** acetaminophen, diphenhydrAMINE, HYDROcodone-acetaminophen, hydrocortisone, sodium chloride flush   Physical Examination  Temp:  [97.8 F (36.6 C)-98.6 F (37 C)] 97.8 F (36.6 C) (06/17 0544) Pulse Rate:  [52-73] 52 (06/17 0544) Resp:  [17-20] 18 (06/17 0544) BP: (104-121)/(67-82) 107/67 (06/17 0544) SpO2:  [100 %] 100 % (06/17 0544) Temp (24hrs), Avg:98.2 F (36.8 C), Min:97.8 F (36.6 C), Max:98.6 F (37  C)  Weight: 48.3 kg GEN: NAD, appears stated age, doesn't appear chronically ill PSYCH: Cooperative, without pressured speech EYE: Conjunctivae pink, sclerae anicteric ENT: MMM CV: Nontachycardic RESP: No wheezing GI: NABS, soft, mild tenderness to palpation in the midepigastrium and periumbilical region, no rebound  MSK/EXT: No lower extremity edema SKIN: No jaundice NEURO:  Alert & Oriented x 3, no focal deficits, no evidence of asterixis   Review of Data   Laboratory Studies   Recent Labs  Lab 08/06/20 0220  NA 136  K 3.8  CL 103  CO2 27  BUN <5*  CREATININE 0.68  GLUCOSE 146*  CALCIUM 9.0  MG 2.0  PHOS 3.5   Recent Labs  Lab 08/03/20 1013  AST 13*  ALT 9  ALKPHOS 47    Recent Labs  Lab 08/04/20 0346 08/05/20 0500 08/06/20 0220  WBC 13.4* 10.9* 12.9*  HGB 9.0* 8.5* 7.8*  HCT 30.2* 29.5* 26.6*  PLT 491* 587* 541*   Recent Labs  Lab 08/06/20 0220  INR 1.1    Imaging Studies  No results found.  GI Procedures and Studies  No new studies to review   ASSESSMENT  Isaiah Carpenter is a 25 y.o. malewithout a significant prior PMH who likely now has diagnosis of left-sided colitis (pending biopsies).  The patient is hemodynamically stable.  Seems to be improving on IV steroids.  Will be initiated on oral steroids  today.  Patient will have a protracted course but he is making progress and I am happy with where things are so far.  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  Hopefully will be back today versus early next week.  Does not change my management currently that they are not back.  Taper of steroids as below.  Patient okay for discharge if no other contraindication from medicine perspective.  All patient questions were answered to the best of my ability, and the patient agrees to the aforementioned plan of action with follow-up as indicated.   PLAN/RECOMMENDATIONS  Prednisone taper - Prednisone 40 mg x 2 weeks -  Prednisone 30 mg x 2 weeks - Prednisone 20 mg x 2 weeks Maintenance at that point until follow-up Will try to get a follow-up in clinic in the next 2 to 3 weeks with myself or one of the APP's Continue oral iron daily Continue PPI daily We will follow-up pathology Outpatient colorectal surgery evaluation of condyloma No contraindication from GI standpoint for potential discharge   We will sign off at this time, please let us know if there is anything else we can be of assistance with.  Please page/call with questions or concerns.   Corliss Parish, MD [facility] Office # [phone]    LOS: 4 days  Isaiah Carpenter  08/07/2020, 9:05 AM

## 2020-08-08 ENCOUNTER — Encounter: Payer: Self-pay | Admitting: Gastroenterology

## 2020-08-09 LAB — QUANTIFERON-TB GOLD PLUS (RQFGPL)
QuantiFERON Mitogen Value: 1.96 IU/mL
QuantiFERON Nil Value: 0 IU/mL
QuantiFERON TB1 Ag Value: 0 IU/mL
QuantiFERON TB2 Ag Value: 0.01 IU/mL

## 2020-08-09 LAB — QUANTIFERON-TB GOLD PLUS: QuantiFERON-TB Gold Plus: NEGATIVE

## 2020-08-09 LAB — CALPROTECTIN, FECAL: Calprotectin, Fecal: 3077 ug/g — ABNORMAL HIGH (ref 0–120)

## 2020-08-10 LAB — THIOPURINE METHYLTRANSFERASE (TPMT), RBC: TPMT Activity:: 27 Units/mL RBC

## 2020-09-03 ENCOUNTER — Ambulatory Visit: Payer: Self-pay | Admitting: Gastroenterology

## 2020-10-22 ENCOUNTER — Ambulatory Visit (INDEPENDENT_AMBULATORY_CARE_PROVIDER_SITE_OTHER): Payer: Self-pay | Admitting: Gastroenterology

## 2020-10-22 ENCOUNTER — Other Ambulatory Visit (INDEPENDENT_AMBULATORY_CARE_PROVIDER_SITE_OTHER): Payer: Self-pay

## 2020-10-22 ENCOUNTER — Encounter: Payer: Self-pay | Admitting: Gastroenterology

## 2020-10-22 VITALS — BP 98/60 | HR 115 | Ht 67.0 in | Wt 120.0 lb

## 2020-10-22 DIAGNOSIS — K51511 Left sided colitis with rectal bleeding: Secondary | ICD-10-CM | POA: Insufficient documentation

## 2020-10-22 DIAGNOSIS — Z886 Allergy status to analgesic agent status: Secondary | ICD-10-CM

## 2020-10-22 DIAGNOSIS — K625 Hemorrhage of anus and rectum: Secondary | ICD-10-CM

## 2020-10-22 DIAGNOSIS — R198 Other specified symptoms and signs involving the digestive system and abdomen: Secondary | ICD-10-CM

## 2020-10-22 DIAGNOSIS — K2100 Gastro-esophageal reflux disease with esophagitis, without bleeding: Secondary | ICD-10-CM

## 2020-10-22 DIAGNOSIS — K529 Noninfective gastroenteritis and colitis, unspecified: Secondary | ICD-10-CM

## 2020-10-22 DIAGNOSIS — D500 Iron deficiency anemia secondary to blood loss (chronic): Secondary | ICD-10-CM

## 2020-10-22 DIAGNOSIS — K219 Gastro-esophageal reflux disease without esophagitis: Secondary | ICD-10-CM

## 2020-10-22 LAB — COMPREHENSIVE METABOLIC PANEL
ALT: 5 U/L (ref 0–53)
AST: 6 U/L (ref 0–37)
Albumin: 3.2 g/dL — ABNORMAL LOW (ref 3.5–5.2)
Alkaline Phosphatase: 56 U/L (ref 39–117)
BUN: 8 mg/dL (ref 6–23)
CO2: 31 mEq/L (ref 19–32)
Calcium: 9.9 mg/dL (ref 8.4–10.5)
Chloride: 99 mEq/L (ref 96–112)
Creatinine, Ser: 0.82 mg/dL (ref 0.40–1.50)
GFR: 123.82 mL/min (ref >60.00)
Glucose, Bld: 92 mg/dL (ref 70–99)
Potassium: 4.6 mEq/L (ref 3.5–5.1)
Sodium: 136 mEq/L (ref 135–145)
Total Bilirubin: 0.3 mg/dL (ref 0.2–1.2)
Total Protein: 6.8 g/dL (ref 6.0–8.3)

## 2020-10-22 LAB — CBC
HCT: 31 % — ABNORMAL LOW (ref 39.0–52.0)
Hemoglobin: 10 g/dL — ABNORMAL LOW (ref 13.0–17.0)
MCHC: 32.3 g/dL (ref 30.0–36.0)
MCV: 70.9 fl — ABNORMAL LOW (ref 78.0–100.0)
Platelets: 751 10*3/uL — ABNORMAL HIGH (ref 150.0–400.0)
RBC: 4.37 Mil/uL (ref 4.22–5.81)
RDW: 20.4 % — ABNORMAL HIGH (ref 11.5–15.5)
WBC: 8.9 10*3/uL (ref 4.0–10.5)

## 2020-10-22 LAB — SEDIMENTATION RATE: Sed Rate: 40 mm/hr — ABNORMAL HIGH (ref 0–15)

## 2020-10-22 LAB — C-REACTIVE PROTEIN: CRP: 4.5 mg/dL (ref 0.5–20.0)

## 2020-10-22 MED ORDER — FERROUS SULFATE 325 (65 FE) MG PO TABS: 325.0000 mg | ORAL_TABLET | Freq: Every day | ORAL | 12 refills | Status: DC

## 2020-10-22 MED ORDER — BUDESONIDE ER 9 MG PO TB24: 9.0000 mg | ORAL_TABLET | Freq: Every day | ORAL | 2 refills | Status: DC

## 2020-10-22 MED ORDER — BUDESONIDE 3 MG PO CPEP: 9.0000 mg | ORAL_CAPSULE | Freq: Every day | ORAL | 0 refills | Status: DC

## 2020-10-22 MED ORDER — PANTOPRAZOLE SODIUM 20 MG PO TBEC: 20.0000 mg | DELAYED_RELEASE_TABLET | Freq: Every day | ORAL | 2 refills | Status: DC

## 2020-10-22 NOTE — Patient Instructions (Addendum)
We have sent the following medications to your pharmacy for you to pick up at your convenience: Pantoprazole  -sent to [facility] , Budesonide( hand written prescription given to you to take to [facility]).    Please take Budesonide prescription to [facility] pharmacy along with the good rx card given to you today.   Please send mychart message  or it does not help, Dr.Mansouraty will consider changing to Prednisone.   Please keep follow-up appointment on: 12/03/20 @ 1:30pm  We will try to get some information for patient assistance regarding Budesonide.    If you are age 26 or younger, your body mass index should be between 19-25. Your Body mass index is 18.79 kg/m. If this is out of the aformentioned range listed, please consider follow up with your Primary Care Provider.   __________________________________________________________  The [facility] providers would like to encourage you to use [facility] to communicate with providers for non-urgent requests or questions.  Due to long hold times on the telephone, sending your provider a message by [facility] may be a faster and more efficient way to get a response.  Please allow 48 business hours for a response.  Please remember that this is for non-urgent requests.   Thank you for choosing me and [facility].  Dr. Meridee Score

## 2020-10-22 NOTE — Progress Notes (Signed)
GASTROENTEROLOGY OUTPATIENT CLINIC VISIT   Primary Care Provider Pcp, No No address on file None   Patient Profile: Isaiah Carpenter is a 25 y.o. male with a pmh significant for left-sided colitis (likely underlying IBD), GERD (with esophagitis) NSAID anaphylaxis.  The patient presents to the [facility] for an evaluation and management of problem(s) noted below:  Problem List 1. Left sided colitis with rectal bleeding ([place])   2. Chronic diarrhea   3. Rectal bleeding   4. Tenesmus   5. Iron deficiency anemia due to chronic blood loss   6. Gastroesophageal reflux disease with esophagitis without hemorrhage   7. 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  He presented with significant abdominal pain, hematochezia, iron deficiency anemia, and cross-sectional imaging concerning for underlying inflammatory bowel disease.  An upper endoscopy was performed showing evidence of significant grade C esophagitis and H. pylori negative gastritis.  A colonoscopy was attempted and was only able to enter into the transverse colon as a result of significant inflammation and narrowing and congestion of the left colon with biopsies showing evidence of acute ulcerative inflammation.  The patient was initiated on oral steroids with a plan for tapering.  During his hospitalization he had significant clinical improvement after being transition from IV steroids to oral steroids.  The patient did not tolerate IV iron and so he was placed as an allergy in his list.  He has tolerated oral iron.  Unfortunately, the patient did not have insurance at the time and still does not have insurance.  He was doing well for the first few weeks of his steroid taper but as he further taper down his symptoms worsen.  Patient states that now that he has been off of all steroids for a few weeks he has had a significant  worsening of his symptoms.  He is having between 8 and 12 bowel movements per day.  4-6 of those are bloody.  He wakes up every 2-3 hours to have a bowel movement at night.  He has urgency as well as tenesmus.  He is not taking any nonsteroidals or BC/Goody powders.  This is causing him significant issues.  He is feeling more fatigued and he wonders if this is because the oral iron he was taking previously as a prescription was potentially stronger than the oral iron he is getting over-the-counter.  He is still in a relationship with his male partner.  There is no anal receptive intercourse.  Patient hopes that we can help him feel better.  He is not sure when he will be able to get insurance again.  Unfortunately due to the level of his recent job position, he is not going to be heading back to school.  The patient's weight is stable.  GI Review of Systems Positive as above including episodes of pyrosis (he is now off PPI) Negative for dysphagia, odynophagia, nausea, vomiting  Review of Systems General: Denies fevers/chills/weight loss unintentionally HEENT: Denies oral lesions Cardiovascular: Denies chest pain/palpitations Pulmonary: Denies shortness of breath Gastroenterological: See HPI Genitourinary: Denies darkened urine or hematuria Hematological: Denies easy bruising/bleeding Endocrine: Denies temperature intolerance Dermatological: Denies jaundice or skin nodules or skin erythema Psychological: Mood is stable Allergy & Immunology: We discussed NSAID anaphylaxis Musculoskeletal: Denies new arthralgias   Medications Current Outpatient Medications  Medication Sig Dispense Refill   acetaminophen (TYLENOL) 500 MG tablet Take 500-1,000  mg by mouth every 8 (eight) hours as needed for mild pain (or headaches).     budesonide (ENTOCORT EC) 3 MG 24 hr capsule Take 3 capsules (9 mg total) by mouth daily. 90 capsule 0   diphenhydrAMINE (BENADRYL) 25 MG tablet Take 1 tablet (25 mg total) by  mouth every 6 (six) hours as needed for allergies or itching. 30 tablet 0   pantoprazole (PROTONIX) 20 MG tablet Take 1 tablet (20 mg total) by mouth daily. 30 tablet 2   hydrocortisone (ANUSOL-HC) 25 MG suppository Place 1 suppository (25 mg total) rectally 2 (two) times daily as needed for hemorrhoids or anal itching. (Patient not taking: Reported on 10/22/2020) 60 suppository 0   No current facility-administered medications for this visit.    Allergies Allergies  Allergen Reactions   Aleve [Naproxen Sodium] Anaphylaxis   Feraheme [Ferumoxytol] Shortness Of Breath    Patient had hot flushes and tight ness in ches within 5 minutes of iv infusion   Ibuprofen Anaphylaxis and Swelling    Histories Past Medical History:  Diagnosis Date   Anemia    Past Surgical History:  Procedure Laterality Date   BIOPSY  08/04/2020   Procedure: BIOPSY;  Surgeon: Irving Copas., MD;  Location: [place];  Service: Gastroenterology;;   COLONOSCOPY     COLONOSCOPY WITH PROPOFOL N/A 08/04/2020   Procedure: COLONOSCOPY;  Surgeon: Irving Copas., MD;  Location: [facility] ENDOSCOPY;  Service: Gastroenterology;  Laterality: N/A;   ESOPHAGOGASTRODUODENOSCOPY N/A 08/04/2020   Procedure: ESOPHAGOGASTRODUODENOSCOPY (EGD);  Surgeon: Irving Copas., MD;  Location: [place];  Service: Gastroenterology;  Laterality: N/A;   UPPER GASTROINTESTINAL ENDOSCOPY     Social History   Socioeconomic History   Marital status: Single    Spouse name: Not on file   Number of children: Not on file   Years of education: Not on file   Highest education level: Not on file  Occupational History   Not on file  Tobacco Use   Smoking status: Former    Packs/day: 0.50    Types: Cigarettes   Smokeless tobacco: Never  Vaping Use   Vaping Use: Some days  Substance and Sexual Activity   Alcohol use: Yes    Comment: rarely   Drug use: Never   Sexual activity: Yes  Other Topics Concern   Not on file   Social History Narrative   Not on file   Social Determinants of Health   Financial Resource Strain: Not on file  Food Insecurity: Not on file  Transportation Needs: Not on file  Physical Activity: Not on file  Stress: Not on file  Social Connections: Not on file  Intimate Partner Violence: Not on file   Family History  Problem Relation Age of Onset   Colon cancer Neg Hx    Esophageal cancer Neg Hx    Stomach cancer Neg Hx    Inflammatory bowel disease Neg Hx    Liver disease Neg Hx    Pancreatic cancer Neg Hx    Rectal cancer Neg Hx    I have reviewed his medical, social, and family history in detail and updated the electronic medical record as necessary.    PHYSICAL EXAMINATION  BP 98/60   Pulse (!) 115   Ht _0  (1.702 m)   Wt 120 lb (54.4 kg)   SpO2 99%   BMI 18.79 kg/m  Wt Readings from Last 3 Encounters:  10/22/20 120 lb (54.4 kg)  08/04/20 106 lb 7.7 oz (48.3  kg)  01/27/20 145 lb (65.8 kg)  GEN: NAD, appears stated age, doesn't appear chronically ill PSYCH: Cooperative, without pressured speech EYE: Conjunctivae pink, sclerae anicteric ENT: MMM, without oral ulcers CV: Tachycardic but regular without R/Gs  RESP: CTAB posteriorly, without wheezing GI: NABS, soft, mild tenderness to palpation in the lower abdomen, ND, without rebound or guarding GU: DRE deferred MSK/EXT: No lower extremity edema SKIN: No jaundice, no concerning rashes NEURO:  Alert & Oriented x 3, no focal deficits   REVIEW OF DATA  I reviewed the following data at the time of this encounter:  GI Procedures and Studies  June 2022 EGD - No gross lesions in esophagus. - LA Grade C erosive esophagitis with no bleeding. Biopsied. - Erythematous mucosa in the stomach. Biopsied. - Duodenitis. Biopsied.  June 2022 Colonoscopy - Perianal condylomata found on perianal exam. - Rectal tenderness found on digital rectal exam. - Severe (Mayo Score 3) inflammatory bowel disease. Biopsied. -  Stool at the splenic flexure. - Non-bleeding non-thrombosed external and internal hemorrhoids. - Colonoscopy was incomplete due to the amount of solid stool present - suspect that this is a result of the inflammation/congestion/swelling that has led to this not being able to pass.  Pathology FINAL MICROSCOPIC DIAGNOSIS:  A.   DUODENUM, BIOPSY:  - Duodenal mucosa with normal villous architecture.  - No villous atrophy or increased intraepithelial lymphocytes.  B.   STOMACH, BIOPSY:  - Mild chronic gastritis.  - Warthin-Starry negative for Helicobacter pylori.  - No intestinal metaplasia, dysplasia or carcinoma.  C.   ESOPHAGUS, BIOPSY:  - Gastroesophageal mucosa with slight chronic inflammation.  - No eosinophilic esophagitis (less than 5 per high-power field).  D.   LEFT COLON/RECTUM, BIOPSY:  - Inflamed colonic mucosa with exudate and granulation tissue consistent  with ulcer.  - See comment.  COMMENT:        D.  The colon biopsies have features consistent with ulcer.  There  are a few foci with mildly increased basal plasma cells and the  differential includes inflammatory bowel disease although there are  insufficient chronic changes to establish the diagnosis in these  biopsies.   Laboratory Studies  Reviewed those in epic  Imaging Studies  June 2022 CT abdomen pelvis with contrast IMPRESSION: 1. Symmetric wall thickening involving the rectum extending to the level of the mid sigmoid colon with some adjacent inflammatory stranding and prominent mesorectal lymph nodes. Findings most consistent with infectious or inflammatory proctitis/colitis including ulcerative colitis. Gastroenterology consult suggested. 2. Large volume of formed stool throughout the remainder of the colon suggesting constipation. 3. Cholelithiasis without findings of acute cholecystitis.   ASSESSMENT  Mr. Sparks is a 25 y.o. malewith a pmh significant for left-sided colitis (likely underlying  IBD), GERD (with esophagitis) NSAID anaphylaxis.  The patient is seen today for evaluation and management of:  1. Left sided colitis with rectal bleeding ([place])   2. Chronic diarrhea   3. Rectal bleeding   4. Tenesmus   5. Iron deficiency anemia due to chronic blood loss   6. Gastroesophageal reflux disease with esophagitis without hemorrhage   7. 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  Clinically however, it seems that his significant colitis is not in remission.  This makes sense as it has taken him quite a while for him to return to clinic.  He is also been tapered off of his steroids completely which was the plan initially but with the plan  that he would have been seen sooner.  Unfortunately, his overall clinical trajectory and treatment options are somewhat limited as a result of his NSAID anaphylaxis.  I am going to try and set up a referral to allergy and immunology to see if they would consider an aspirin desensitization to see whether he may be a candidate at some point for 5-ASA or sulfasalazine products.  He likely will not be able to see them until his insurance issues are figured out but we will go ahead and place the order for that.  We discussed potential role of extended release or budesonide-MMX therapy in an effort of trying to get budesonide to the colon.  We will try to get the patient you serous, if we cannot we will go ahead and just trial normal budesonide.  If he does not have improvement on budesonide or on Uceris (if he needs a patient assistance program if anything is available we are happy to do paperwork) then we will likely need to put him back on prednisone and consider oral Biologics or other biologic agents.  Hopefully can get insurance soon and that will help [place] overall.  Time will tell.  We will get some updated labs.  We will send a prescription for iron supplementation as I expect he will be iron deficient.  We  will also want to make sure that he does not have C. difficile and so we will rule out C. difficile infection as well.  We will see him back in a few weeks time.  We will see how his labs look at this time and decide if anything else needs to be done.  All patient questions were answered to the best of my ability, and the patient agrees to the aforementioned plan of action with follow-up as indicated.  PLAN  Laboratories as outlined below C. difficile PCR to be obtained Try to get Uceris patient assistance if possible if not we will trial budesonide Budesonide 9 mg daily for now If after few weeks time he is not noticing an improvement we may transition specifically back to prednisone Allergy referral for consideration of aspirin desensitization Restart PPI 20 mg daily Consider biologic agents if patient unable to have oral therapies initiated   Orders Placed This Encounter  Procedures   CBC   Comp Met (CMET)   Sedimentation rate   C-reactive protein   Clostridium difficile Toxin B, Qualitative, Real-Time PCR    New Prescriptions   BUDESONIDE (ENTOCORT EC) 3 MG 24 HR CAPSULE    Take 3 capsules (9 mg total) by mouth daily.   PANTOPRAZOLE (PROTONIX) 20 MG TABLET    Take 1 tablet (20 mg total) by mouth daily.   Modified Medications   No medications on file    Planned Follow Up No follow-ups on file.   Total Time in Face-to-Face and in Coordination of Care for patient including independent/personal interpretation/review of prior testing, medical history, examination, medication adjustment, communicating results with the patient directly, and documentation with the EHR is 35 minutes.   Justice Britain, MD [facility] Office # [phone]

## 2020-10-28 ENCOUNTER — Other Ambulatory Visit: Payer: Self-pay

## 2020-10-28 MED ORDER — MESALAMINE 1000 MG RE SUPP: 1000.0000 mg | Freq: Every day | RECTAL | 12 refills | Status: DC

## 2020-10-28 MED ORDER — PREDNISONE 20 MG PO TABS: 40.0000 mg | ORAL_TABLET | Freq: Every day | ORAL | 0 refills | Status: DC

## 2020-10-28 NOTE — Telephone Encounter (Signed)
Send my chart message.  Not clear that he is getting any effect from budesonide.  We thought this was a possibility since his colon disease has been felt to be left-sided and we could not and did not have Budesonide MMX-UCeris samples and the cost was too expensive. He is failing this. We will begin the patient on prednisone 40 mg daily. We will also begin the patient on Canasa suppositories nightly. Patient will maintain 40 mg daily for at least 2 weeks. We need an update next week to see how he is doing, if not doing well may require hospitalization. Likely will need follow-up in clinic in 2 to 3 weeks.  [name], and you can work on updated prednisone and Canasa in clinic visit that would be helpful.  Rovonda, did not know if you have found any more information about whether there is a Uceris patient assistance program.  Thanks to both. GM

## 2020-11-22 ENCOUNTER — Other Ambulatory Visit: Payer: Self-pay

## 2020-11-23 ENCOUNTER — Other Ambulatory Visit: Payer: Self-pay

## 2020-11-23 MED ORDER — PREDNISONE 20 MG PO TABS: 40.0000 mg | ORAL_TABLET | Freq: Every day | ORAL | 0 refills | Status: DC

## 2020-11-23 MED ORDER — PREDNISONE 20 MG PO TABS: 20.0000 mg | ORAL_TABLET | Freq: Every day | ORAL | 0 refills | Status: AC

## 2020-11-23 NOTE — Telephone Encounter (Signed)
Prescription for Prednisone 20mg  - once daily sent to [facility]

## 2020-11-23 NOTE — Addendum Note (Signed)
Addended by: Lucky Rathke on: 11/23/2020 11:22 AM   Modules accepted: Orders

## 2020-12-03 ENCOUNTER — Encounter: Payer: Self-pay | Admitting: Gastroenterology

## 2020-12-03 ENCOUNTER — Ambulatory Visit (INDEPENDENT_AMBULATORY_CARE_PROVIDER_SITE_OTHER): Payer: Self-pay | Admitting: Gastroenterology

## 2020-12-03 VITALS — BP 112/70 | HR 112 | Ht 66.5 in | Wt 128.5 lb

## 2020-12-03 DIAGNOSIS — Z7952 Long term (current) use of systemic steroids: Secondary | ICD-10-CM

## 2020-12-03 DIAGNOSIS — K625 Hemorrhage of anus and rectum: Secondary | ICD-10-CM

## 2020-12-03 DIAGNOSIS — K51511 Left sided colitis with rectal bleeding: Secondary | ICD-10-CM

## 2020-12-03 DIAGNOSIS — K51919 Ulcerative colitis, unspecified with unspecified complications: Secondary | ICD-10-CM

## 2020-12-03 DIAGNOSIS — D84821 Immunodeficiency due to drugs: Secondary | ICD-10-CM

## 2020-12-03 DIAGNOSIS — Z5989 Other problems related to housing and economic circumstances: Secondary | ICD-10-CM

## 2020-12-03 DIAGNOSIS — K2100 Gastro-esophageal reflux disease with esophagitis, without bleeding: Secondary | ICD-10-CM

## 2020-12-03 MED ORDER — MERCAPTOPURINE 50 MG PO TABS: 50.0000 mg | ORAL_TABLET | Freq: Every day | ORAL | 2 refills | Status: DC

## 2020-12-03 MED ORDER — PREDNISONE 10 MG PO TABS: ORAL_TABLET | ORAL | 0 refills | Status: DC

## 2020-12-03 NOTE — Progress Notes (Signed)
GASTROENTEROLOGY OUTPATIENT CLINIC VISIT   Primary Care Provider Pcp, No No address on file None   Patient Profile: Starsky Nanna is a 25 y.o. male with a pmh significant for left-sided colitis (likely underlying IBD now on steroids), GERD (with esophagitis) NSAID anaphylaxis, underinsured.  The patient presents to the [facility] for an evaluation and management of problem(s) noted below:  Problem List 1. Left sided colitis with rectal bleeding ([place])   2. Rectal bleeding   3. Ulcerative colitis with complication, unspecified location ([place])   4. 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 Underinsured   6. Gastroesophageal reflux disease with esophagitis without hemorrhage     History of Present Illness Please see prior progress notes for full details of HPI.  Interval History The patient returns for scheduled follow-up.  We were unable to get Uceris for the patient.  We trialed generic budesonide with no effect.  We reinitiated prednisone therapy.  The patient has been on 40 mg of prednisone for the last few weeks.  He is doing significantly better at this time.  He has had significant improvement in his bleeding and bowel habits which will be noted below.  He is much happier at this time.  He is doing better at work.  He is looking into insurance options as the marketplace opens in November.  He continues to deny any anal receptive intercourse at this time.  He denies any NSAID use.  He has not been able to see allergy as of yet due to still being self-pay.  BMs per day -between 1 and 3/day Nocturnal BMs -very infrequently wakes up Blood -half of his bowel movements but much less than before Mucous -not frequent Tenesmus -none Urgency -with less than half of his bowel movements and improving Skin Manifestations -denies Eye Manifestations -denies Joint Manifestations -denies  GI Review of Systems Positive as above  Negative for pyrosis  (since he has been back on PPI), odynophagia, dysphagia, abdominal pain, nausea, vomiting   Review of Systems General: Denies fevers/chills/weight loss unintentionally HEENT: Denies oral lesions Cardiovascular: Denies chest pain/palpitations Pulmonary: Denies shortness of breath Gastroenterological: See HPI Genitourinary: Denies darkened urine Hematological: Denies easy bruising/bleeding Dermatological: Denies jaundice or skin nodules or new rashes Psychological: Mood is stable Allergy & Immunology: We rediscussed NSAID anaphylaxis Musculoskeletal: Denies new arthralgias   Medications Current Outpatient Medications  Medication Sig Dispense Refill   acetaminophen (TYLENOL) 500 MG tablet Take 500-1,000 mg by mouth every 8 (eight) hours as needed for mild pain (or headaches).     ferrous sulfate (FERROUSUL) 325 (65 FE) MG tablet Take 1 tablet (325 mg total) by mouth daily with breakfast. 30 tablet 12   mercaptopurine (PURINETHOL) 50 MG tablet Take 1 tablet (50 mg total) by mouth daily. Give on an empty stomach 1 hour before or 2 hours after meals. Caution: Chemotherapy. 30 tablet 2   predniSONE (DELTASONE) 10 MG tablet Take 3 tablets (30 mg total) by mouth daily with breakfast for 37 days, THEN 2 tablets (20 mg total) daily with breakfast. 180 tablet 0   predniSONE (DELTASONE) 20 MG tablet Take 1 tablet (20 mg total) by mouth daily with breakfast. 40 tablet 0   diphenhydrAMINE (BENADRYL) 25 MG tablet Take 1 tablet (25 mg total) by mouth every 6 (six) hours as needed for allergies or itching. (Patient not taking: Reported on 12/03/2020) 30 tablet 0   hydrocortisone (ANUSOL-HC) 25 MG suppository Place 1 suppository (25 mg total) rectally 2 (  two) times daily as needed for hemorrhoids or anal itching. (Patient not taking: No sig reported) 60 suppository 0   mesalamine (CANASA) 1000 MG suppository Place 1 suppository (1,000 mg total) rectally at bedtime. (Patient not taking: Reported on 12/03/2020)  30 suppository 12   pantoprazole (PROTONIX) 20 MG tablet Take 1 tablet (20 mg total) by mouth daily. (Patient not taking: Reported on 12/03/2020) 30 tablet 2   No current facility-administered medications for this visit.    Allergies Allergies  Allergen Reactions   Aleve [Naproxen Sodium] Anaphylaxis   Feraheme [Ferumoxytol] Shortness Of Breath    Patient had hot flushes and tight ness in ches within 5 minutes of iv infusion   Ibuprofen Anaphylaxis and Swelling    Histories Past Medical History:  Diagnosis Date   Anemia    Past Surgical History:  Procedure Laterality Date   BIOPSY  08/04/2020   Procedure: BIOPSY;  Surgeon: Irving Copas., MD;  Location: [place];  Service: Gastroenterology;;   COLONOSCOPY     COLONOSCOPY WITH PROPOFOL N/A 08/04/2020   Procedure: COLONOSCOPY;  Surgeon: Irving Copas., MD;  Location: [facility] ENDOSCOPY;  Service: Gastroenterology;  Laterality: N/A;   ESOPHAGOGASTRODUODENOSCOPY N/A 08/04/2020   Procedure: ESOPHAGOGASTRODUODENOSCOPY (EGD);  Surgeon: Irving Copas., MD;  Location: [facility];  Service: Gastroenterology;  Laterality: N/A;   UPPER GASTROINTESTINAL ENDOSCOPY     Social History   Socioeconomic History   Marital status: Single    Spouse name: Not on file   Number of children: Not on file   Years of education: Not on file   Highest education level: Not on file  Occupational History   Not on file  Tobacco Use   Smoking status: Former    Packs/day: 0.50    Types: Cigarettes   Smokeless tobacco: Never  Vaping Use   Vaping Use: Some days  Substance and Sexual Activity   Alcohol use: Yes    Comment: rarely   Drug use: Never   Sexual activity: Yes  Other Topics Concern   Not on file  Social History Narrative   Not on file   Social Determinants of Health   Financial Resource Strain: Not on file  Food Insecurity: Not on file  Transportation Needs: Not on file  Physical Activity: Not on file   Stress: Not on file  Social Connections: Not on file  Intimate Partner Violence: Not on file   Family History  Problem Relation Age of Onset   Colon cancer Neg Hx    Esophageal cancer Neg Hx    Stomach cancer Neg Hx    Inflammatory bowel disease Neg Hx    Liver disease Neg Hx    Pancreatic cancer Neg Hx    Rectal cancer Neg Hx    I have reviewed his medical, social, and family history in detail and updated the electronic medical record as necessary.    PHYSICAL EXAMINATION  BP 112/70 (BP Location: Left Arm, Patient Position: Sitting, Cuff Size: Normal)   Pulse (!) 112   Ht 5' 6.5" (1.689 m)   Wt 128 lb 8 oz (58.3 kg)   BMI 20.43 kg/m  Wt Readings from Last 3 Encounters:  12/03/20 128 lb 8 oz (58.3 kg)  10/22/20 120 lb (54.4 kg)  08/04/20 106 lb 7.7 oz (48.3 kg)  GEN: NAD, appears stated age, doesn't appear chronically ill PSYCH: Cooperative, without pressured speech EYE: Conjunctivae pink, sclerae anicteric ENT: MMM CV: Tachycardic but regular without R/Gs  RESP: CTAB posteriorly, without  wheezing GI: NABS, soft, nontender, nondistended, without rebound or guarding MSK/EXT: No lower extremity edema SKIN: No jaundice, no concerning rashes NEURO:  Alert & Oriented x 3, no focal deficits   REVIEW OF DATA  I reviewed the following data at the time of this encounter:  GI Procedures and Studies  Previously reviewed  Laboratory Studies  Reviewed those in epic  Imaging Studies  No new studies to review   ASSESSMENT  Mr. Gloor is a 26 y.o. male with a pmh significant for left-sided colitis (likely underlying IBD now on steroids), GERD (with esophagitis) NSAID anaphylaxis, underinsured.  The patient is seen today for evaluation and management of:  1. Left sided colitis with rectal bleeding ([place])   2. Rectal bleeding   3. Ulcerative colitis with complication, unspecified location ([place])   4. 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 Underinsured    6. Gastroesophageal reflux disease with esophagitis without hemorrhage    The patient is clinically and hemodynamically stable at this time.  The biggest issue is that we cannot continue steroids in the long-term.  Is underinsured/uninsured status makes things more difficult overall.  Today we spent a good amount of time discussing the role of biologic therapy and immunomodulator therapy.  Even if we get approval from allergy to consider mesalamine therapy, as significant as his symptoms have been at this steroid requiring as they have been I do not think that we are going to be able to manage it is solely with mesalamine products.  The patient was given information to further review an effort of trying to define what biologic therapy he may want.  We did discuss initiating immunomodulators therapy at least at lower dosing in effort of future use for decreasing risk of antibody development and Biologics but for now also as a means of trying to get the patient into a steroid free therapeutic regimen.  He understands the risks of chronic steroid use including infection, weight gain, bone density loss, cataract development, and other issues.  We discussed the risks of immunomodulators therapy in regards to increased skin cancers and lymphoma risk being slightly higher.  We also discussed the risks of each of the major biologic agents including the risks that go along with infections as well as lymphoma for some of the Biologics.  He is looking into insurance and hopeful to get some in November to begin in January when the new year starts.  We will do a slow taper of prednisone to no less than 20 mg hopefully to see him in the course of the coming weeks.  With the addition of his immunomodulators we will monitor his CBC and his LFTs in a few weeks and adjust and increase as we can.  All patient questions were answered to the best of my ability, and the patient agrees to the aforementioned plan of action with  follow-up as indicated.   PLAN  Laboratories as outlined below to be drawn in 3 weeks To use prednisone 40 mg through October and taper by the time of next clinic visit to 20 mg and maintain Initiate 6-MP 25 mg daily x1 week and then increase to 50 mg daily Patient to strongly consider biologic agents and update [place] at follow-up Continue PPI daily Awaiting allergy referral for NSAID/aspirin desensitization for potential use of mesalamine Awaiting insurance versus need for patient assistance   Orders Placed This Encounter  Procedures   CBC   Comp Met (CMET)   Sed Rate (  ESR)   C-reactive protein     New Prescriptions   MERCAPTOPURINE (PURINETHOL) 50 MG TABLET    Take 1 tablet (50 mg total) by mouth daily. Give on an empty stomach 1 hour before or 2 hours after meals. Caution: Chemotherapy.   PREDNISONE (DELTASONE) 10 MG TABLET    Take 3 tablets (30 mg total) by mouth daily with breakfast for 37 days, THEN 2 tablets (20 mg total) daily with breakfast.   Modified Medications   No medications on file    Planned Follow Up No follow-ups on file.   Total Time in Face-to-Face and in Coordination of Care for patient including independent/personal interpretation/review of prior testing, medical history, examination, medication adjustment, communicating results with the patient directly, and documentation with the EHR is 25 minutes.   Justice Britain, MD [facility] Advanced [facility]oscopy Office # [phone]

## 2020-12-03 NOTE — Patient Instructions (Addendum)
Continue Prednisone at 30mg  daily through 3rd week in Nov(01/09/21), then Start on 20mg  daily until your follow-up in December.   Start Metacaptopurine 25mg (1/2 tablet) once daily  for 1 week, then increase to 50mg  (1 tablet) once daily thereafter.   We have sent the following medications to your pharmacy for you to pick up at your convenience: Mercaptopurine   Please keep follow-up on : 02/03/21 @ 11:30am   If you are age 23 or younger, your body mass index should be between 19-25. Your Body mass index is 20.43 kg/m. If this is out of the aformentioned range listed, please consider follow up with your Primary Care Provider.   __________________________________________________________  The [place] [facility] providers would like to encourage you to use [facility] to communicate with providers for non-urgent requests or questions.  Due to long hold times on the telephone, sending your provider a message by [facility] may be a faster and more efficient way to get a response.  Please allow 48 business hours for a response.  Please remember that this is for non-urgent requests.   Thank you for choosing me and [facility].  Dr. 77

## 2020-12-05 ENCOUNTER — Encounter: Payer: Self-pay | Admitting: Gastroenterology

## 2020-12-05 DIAGNOSIS — D84821 Immunodeficiency due to drugs: Secondary | ICD-10-CM | POA: Insufficient documentation

## 2020-12-05 DIAGNOSIS — Z5989 Other problems related to housing and economic circumstances: Secondary | ICD-10-CM | POA: Insufficient documentation

## 2020-12-05 DIAGNOSIS — K51919 Ulcerative colitis, unspecified with unspecified complications: Secondary | ICD-10-CM | POA: Insufficient documentation

## 2020-12-05 DIAGNOSIS — K2100 Gastro-esophageal reflux disease with esophagitis, without bleeding: Secondary | ICD-10-CM | POA: Insufficient documentation

## 2020-12-05 DIAGNOSIS — Z7952 Long term (current) use of systemic steroids: Secondary | ICD-10-CM | POA: Insufficient documentation

## 2021-01-04 ENCOUNTER — Other Ambulatory Visit: Payer: Self-pay

## 2021-01-05 MED ORDER — PREDNISONE 10 MG PO TABS: ORAL_TABLET | ORAL | 0 refills | Status: DC

## 2021-01-06 ENCOUNTER — Other Ambulatory Visit: Payer: Self-pay

## 2021-02-03 ENCOUNTER — Other Ambulatory Visit (INDEPENDENT_AMBULATORY_CARE_PROVIDER_SITE_OTHER): Payer: Self-pay

## 2021-02-03 ENCOUNTER — Encounter: Payer: Self-pay | Admitting: Gastroenterology

## 2021-02-03 ENCOUNTER — Ambulatory Visit (INDEPENDENT_AMBULATORY_CARE_PROVIDER_SITE_OTHER): Payer: Self-pay | Admitting: Gastroenterology

## 2021-02-03 VITALS — BP 100/60 | HR 73 | Ht 67.0 in | Wt 156.0 lb

## 2021-02-03 DIAGNOSIS — D649 Anemia, unspecified: Secondary | ICD-10-CM

## 2021-02-03 DIAGNOSIS — K51919 Ulcerative colitis, unspecified with unspecified complications: Secondary | ICD-10-CM

## 2021-02-03 DIAGNOSIS — Z5989 Other problems related to housing and economic circumstances: Secondary | ICD-10-CM

## 2021-02-03 DIAGNOSIS — R635 Abnormal weight gain: Secondary | ICD-10-CM

## 2021-02-03 DIAGNOSIS — K529 Noninfective gastroenteritis and colitis, unspecified: Secondary | ICD-10-CM

## 2021-02-03 LAB — COMPREHENSIVE METABOLIC PANEL
ALT: 20 U/L (ref 0–53)
AST: 7 U/L (ref 0–37)
Albumin: 3.6 g/dL (ref 3.5–5.2)
Alkaline Phosphatase: 46 U/L (ref 39–117)
BUN: 11 mg/dL (ref 6–23)
CO2: 30 mEq/L (ref 19–32)
Calcium: 9.6 mg/dL (ref 8.4–10.5)
Chloride: 104 mEq/L (ref 96–112)
Creatinine, Ser: 0.79 mg/dL (ref 0.40–1.50)
GFR: 124.97 mL/min (ref >60.00)
Glucose, Bld: 105 mg/dL — ABNORMAL HIGH (ref 70–99)
Potassium: 4.6 mEq/L (ref 3.5–5.1)
Sodium: 139 mEq/L (ref 135–145)
Total Bilirubin: 0.3 mg/dL (ref 0.2–1.2)
Total Protein: 6.5 g/dL (ref 6.0–8.3)

## 2021-02-03 LAB — CBC
HCT: 30.7 % — ABNORMAL LOW (ref 39.0–52.0)
Hemoglobin: 9.5 g/dL — ABNORMAL LOW (ref 13.0–17.0)
MCHC: 30.8 g/dL (ref 30.0–36.0)
MCV: 71.1 fl — ABNORMAL LOW (ref 78.0–100.0)
Platelets: 546 10*3/uL — ABNORMAL HIGH (ref 150.0–400.0)
RBC: 4.32 Mil/uL (ref 4.22–5.81)
RDW: 19 % — ABNORMAL HIGH (ref 11.5–15.5)
WBC: 18.4 10*3/uL (ref 4.0–10.5)

## 2021-02-03 LAB — C-REACTIVE PROTEIN: CRP: <1 mg/dL (ref 0.5–20.0)

## 2021-02-03 LAB — SEDIMENTATION RATE: Sed Rate: 18 mm/hr — ABNORMAL HIGH (ref 0–15)

## 2021-02-03 MED ORDER — MERCAPTOPURINE 50 MG PO TABS: ORAL_TABLET | ORAL | 2 refills | Status: DC

## 2021-02-03 MED ORDER — PREDNISONE 10 MG PO TABS: ORAL_TABLET | ORAL | 0 refills | Status: DC

## 2021-02-03 NOTE — Patient Instructions (Addendum)
We have sent the following medications to your pharmacy for you to pick up at your convenience: Prednisone,   Increase your Mercaptopurine to 75mg  (1.5) tablets once daily.   Your provider has requested that you go to the basement level for lab work before leaving today. Press "B" on the elevator. The lab is located at the first door on the left as you exit the elevator.  Please keep follow-up on : 04/07/21 at 1:30pm  You have been given information on [facility]. Please bring this form and needed information back with you at your next appointment.   Thank you for choosing me and [facility].  Dr. [facility]

## 2021-02-04 ENCOUNTER — Encounter: Payer: Self-pay | Admitting: Gastroenterology

## 2021-02-04 ENCOUNTER — Other Ambulatory Visit: Payer: Self-pay

## 2021-02-04 DIAGNOSIS — R635 Abnormal weight gain: Secondary | ICD-10-CM | POA: Insufficient documentation

## 2021-02-04 DIAGNOSIS — D649 Anemia, unspecified: Secondary | ICD-10-CM | POA: Insufficient documentation

## 2021-02-04 NOTE — Progress Notes (Signed)
GASTROENTEROLOGY OUTPATIENT CLINIC VISIT   Primary Care Provider Pcp, No No address on file None   Patient Profile: Isaiah Carpenter is a 25 y.o. male with a pmh significant for left-sided colitis (likely underlying IBD now on steroids), GERD (with esophagitis), NSAID anaphylaxis, anemia, underinsured (should be obtaining insurance at beginning of year).  The patient presents to the [facility] for an evaluation and management of problem(s) noted below:  Problem List 1. Ulcerative colitis with complication, unspecified location ([place])   2. Underinsured   3. Weight gain   4. Anemia, unspecified type     History of Present Illness Please see prior progress notes for full details of HPI.  Interval History The patient returns for scheduled follow-up.  The patient had been doing well when we put him back up to 40 mg of prednisone daily.  He has subsequently been able to titrate downwards to 20 mg daily.  Overall he is doing okay.  He is not noting any blood in his stools.  He is having between 2 and 3 bowel movements per day.  He does experience some tenesmus.  He is not having any significant nocturnal symptoms.  He has had significant weight gain over the course the last few months.  He has gained over 20 pounds.  He will be obtaining insurance at the beginning of this new year and he will let us know as soon as that occurs.  Overall he still feels much better while being on prednisone rather than not being on it.  He is maintaining his current job.  He is not using any nonsteroidals due to his allergy.  He is open to seeing allergy next year once he has insurance.   BMs per day -2-3 Nocturnal BMs -none Blood -none Mucous -not frequent Tenesmus -none Urgency -with less than half of his bowel movements Skin Manifestations -denies Eye Manifestations -denies Joint Manifestations -denies  GI Review of Systems Positive as above  Negative for pyrosis (since he has been  back on PPI), odynophagia, dysphagia, abdominal pain, nausea, vomiting   Review of Systems General: Denies fevers/chills/weight loss unintentionally HEENT: Denies oral lesions Cardiovascular: Denies chest pain Pulmonary: Denies shortness of breath Gastroenterological: See HPI Genitourinary: Denies darkened urine Hematological: Denies easy bruising/bleeding Dermatological: Denies jaundice or skin nodules or new rashes Psychological: Mood is stable Allergy & Immunology: We rediscussed NSAID anaphylaxis Musculoskeletal: Denies new arthralgias   Medications Current Outpatient Medications  Medication Sig Dispense Refill   acetaminophen (TYLENOL) 500 MG tablet Take 500-1,000 mg by mouth every 8 (eight) hours as needed for mild pain (or headaches).     predniSONE (DELTASONE) 10 MG tablet Take 2 tablets by mouth once daily. 100 tablet 0   mercaptopurine (PURINETHOL) 50 MG tablet Take 1.5 tablets by mouth daily. Give on an empty stomach 1 hour before or 2 hours after meals. Caution: Chemotherapy. 90 tablet 2   No current facility-administered medications for this visit.    Allergies Allergies  Allergen Reactions   Aleve [Naproxen Sodium] Anaphylaxis   Feraheme [Ferumoxytol] Shortness Of Breath    Patient had hot flushes and tight ness in ches within 5 minutes of iv infusion   Ibuprofen Anaphylaxis and Swelling    Histories Past Medical History:  Diagnosis Date   Anemia    Past Surgical History:  Procedure Laterality Date   BIOPSY  08/04/2020   Procedure: BIOPSY;  Surgeon: Irving Copas., MD;  Location: [facility] ENDOSCOPY;  Service: Gastroenterology;;   COLONOSCOPY  COLONOSCOPY WITH PROPOFOL N/A 08/04/2020   Procedure: COLONOSCOPY;  Surgeon: Mansouraty, Telford Nab., MD;  Location: [place];  Service: Gastroenterology;  Laterality: N/A;   ESOPHAGOGASTRODUODENOSCOPY N/A 08/04/2020   Procedure: ESOPHAGOGASTRODUODENOSCOPY (EGD);  Surgeon: Irving Copas., MD;   Location: [place];  Service: Gastroenterology;  Laterality: N/A;   UPPER GASTROINTESTINAL ENDOSCOPY     Social History   Socioeconomic History   Marital status: Single    Spouse name: Not on file   Number of children: Not on file   Years of education: Not on file   Highest education level: Not on file  Occupational History   Not on file  Tobacco Use   Smoking status: Former    Packs/day: 0.50    Types: Cigarettes   Smokeless tobacco: Never  Vaping Use   Vaping Use: Some days  Substance and Sexual Activity   Alcohol use: Yes    Comment: rarely   Drug use: Never   Sexual activity: Yes  Other Topics Concern   Not on file  Social History Narrative   Not on file   Social Determinants of Health   Financial Resource Strain: Not on file  Food Insecurity: Not on file  Transportation Needs: Not on file  Physical Activity: Not on file  Stress: Not on file  Social Connections: Not on file  Intimate Partner Violence: Not on file   Family History  Problem Relation Age of Onset   Colon cancer Neg Hx    Esophageal cancer Neg Hx    Stomach cancer Neg Hx    Inflammatory bowel disease Neg Hx    Liver disease Neg Hx    Pancreatic cancer Neg Hx    Rectal cancer Neg Hx    I have reviewed his medical, social, and family history in detail and updated the electronic medical record as necessary.    PHYSICAL EXAMINATION  BP 100/60    Pulse 73    Ht '5\' 7"'  (1.702 m)    Wt 156 lb (70.8 kg)    BMI 24.43 kg/m  Wt Readings from Last 3 Encounters:  02/03/21 156 lb (70.8 kg)  12/03/20 128 lb 8 oz (58.3 kg)  10/22/20 120 lb (54.4 kg)  GEN: NAD, appears stated age, doesn't appear chronically ill PSYCH: Cooperative, without pressured speech EYE: Conjunctivae pink, sclerae anicteric ENT: MMM, no oral ulcers or lesions CV: Nontachycardic RESP: CTAB posteriorly, without wheezing GI: NABS, soft, rounded abdomen, nontender, nondistended, without rebound or guarding MSK/EXT: No lower  extremity edema SKIN: No jaundice, no concerning rashes NEURO:  Alert & Oriented x 3, no focal deficits   REVIEW OF DATA  I reviewed the following data at the time of this encounter:  GI Procedures and Studies  Previously reviewed  Laboratory Studies  Reviewed those in epic  Imaging Studies  No new studies to review   ASSESSMENT  Mr. Trickey is a 25 y.o. male with a pmh significant for left-sided colitis (likely underlying IBD now on steroids), GERD (with esophagitis), NSAID anaphylaxis, anemia, underinsured (should be obtaining insurance at beginning of year).  The patient is seen today for evaluation and management of:  1. Ulcerative colitis with complication, unspecified location ([place])   2. Underinsured   3. Weight gain   4. Anemia, unspecified type    The patient is hemodynamically stable at this time.  Clinically he is not as good as he was when we last saw him but he has had to down titrate his prednisone.  We  are going to keep him at 20 mg daily.  At the beginning of the year he will have insurance.  We will initiate biologic therapy as soon as we can in the beginning of the year.  After further discussion and thoughts, the patient would like to move forward with an anti-TNF.  Humira is his choice as a result of ability to administer it himself.  We rediscussed the role of biologic therapy with immunomodulators.  We discussed the risks of biologic therapy especially in young males.  We do know that we need to get him off steroids as soon as we can due to his complications of weight gain and by doing so we can decrease some of his other issues in the long-term including bone density loss and cataract development and other issues.  Thankfully, insurance will be in place by the beginning of the year.  All patient questions were answered to the best of my ability, and the patient agrees to the aforementioned plan of action with follow-up as indicated.   PLAN  Laboratories as  outlined below Continue prednisone 20 mg daily for now Increase 6-MP to 75 mg daily (pending LFTs and no cytopenia) At the beginning of the year, once insurance card is available, we will plan to proceed with Humira - 160/80/40, normal induction  Continue PPI daily Awaiting allergy referral for NSAID/aspirin desensitization for potential use of mesalamine If patient remains anemic, will plan anemia studies and likely iron initiation p.o.   Orders Placed This Encounter  Procedures   CBC   Comp Met (CMET)   Sedimentation rate   C-reactive protein     New Prescriptions   PREDNISONE (DELTASONE) 10 MG TABLET    Take 2 tablets by mouth once daily.   Modified Medications   Modified Medication Previous Medication   MERCAPTOPURINE (PURINETHOL) 50 MG TABLET mercaptopurine (PURINETHOL) 50 MG tablet      Take 1.5 tablets by mouth daily. Give on an empty stomach 1 hour before or 2 hours after meals. Caution: Chemotherapy.    Take 1 tablet (50 mg total) by mouth daily. Give on an empty stomach 1 hour before or 2 hours after meals. Caution: Chemotherapy.    Planned Follow Up No follow-ups on file.   Total Time in Face-to-Face and in Coordination of Care for patient including independent/personal interpretation/review of prior testing, medical history, examination, medication adjustment, communicating results with the patient directly, and documentation with the EHR is 25 minutes.   Justice Britain, MD [facility] Advanced [facility]oscopy Office # [phone]

## 2021-02-05 ENCOUNTER — Other Ambulatory Visit (INDEPENDENT_AMBULATORY_CARE_PROVIDER_SITE_OTHER): Payer: Self-pay

## 2021-02-05 DIAGNOSIS — D649 Anemia, unspecified: Secondary | ICD-10-CM

## 2021-02-05 LAB — IBC + FERRITIN
Ferritin: 5.2 ng/mL — ABNORMAL LOW (ref 22.0–322.0)
Iron: 47 ug/dL (ref 42–165)
Saturation Ratios: 10.2 % — ABNORMAL LOW (ref 20.0–50.0)
TIBC: 462 ug/dL — ABNORMAL HIGH (ref 250.0–450.0)
Transferrin: 330 mg/dL (ref 212.0–360.0)

## 2021-02-05 LAB — VITAMIN B12: Vitamin B-12: 115 pg/mL — ABNORMAL LOW (ref 211–911)

## 2021-02-05 LAB — FOLATE: Folate: 7.7 ng/mL (ref >5.9)

## 2021-02-08 ENCOUNTER — Other Ambulatory Visit: Payer: Self-pay

## 2021-02-08 DIAGNOSIS — D649 Anemia, unspecified: Secondary | ICD-10-CM

## 2021-02-08 MED ORDER — VITAMIN B-12 1000 MCG PO TABS: 1000.0000 ug | ORAL_TABLET | Freq: Every day | ORAL | 1 refills | Status: AC

## 2021-02-08 MED ORDER — FERROUS SULFATE 325 (65 FE) MG PO TBEC: 325.0000 mg | DELAYED_RELEASE_TABLET | Freq: Every day | ORAL | 0 refills | Status: DC

## 2021-02-22 ENCOUNTER — Encounter: Payer: Self-pay | Admitting: Gastroenterology

## 2021-02-23 ENCOUNTER — Telehealth: Payer: Self-pay

## 2021-02-23 NOTE — Telephone Encounter (Signed)
[name], Please initiate Humira. Typical dosing for induction. Can set him up for the Humira RN navigator as well. Thanks. GM

## 2021-02-23 NOTE — Telephone Encounter (Signed)
See 1/3 My Chart message pt has new insurance and is ready to start Humira.

## 2021-02-23 NOTE — Telephone Encounter (Signed)
Dr Meridee Score the pt scanned a copy of his new insurance card into the system. He is ready to initiate Humira.   Is this ok to start the process?

## 2021-02-24 MED ORDER — HUMIRA (2 PEN) 40 MG/0.4ML ~~LOC~~ AJKT: 40.0000 mg | AUTO-INJECTOR | SUBCUTANEOUS | 6 refills | Status: DC

## 2021-02-24 MED ORDER — HUMIRA-CD/UC/HS STARTER 80 MG/0.8ML ~~LOC~~ AJKT: AUTO-INJECTOR | SUBCUTANEOUS | 0 refills | Status: DC

## 2021-02-24 NOTE — Telephone Encounter (Signed)
Humira prescription sent to [facility]  The pt has been advised to respond to any calls or text.  I have asked [facility] to set up the [employer] as well.

## 2021-02-25 NOTE — Telephone Encounter (Signed)
Received a fax from [facility] the referral has been received and is in process.  Chart notes and insurance information has been sent to [facility].

## 2021-02-27 ENCOUNTER — Other Ambulatory Visit: Payer: Self-pay | Admitting: Gastroenterology

## 2021-03-01 MED ORDER — PREDNISONE 10 MG PO TABS: ORAL_TABLET | ORAL | 0 refills | Status: DC

## 2021-03-04 MED ORDER — HUMIRA-CD/UC/HS STARTER 80 MG/0.8ML ~~LOC~~ AJKT: AUTO-INJECTOR | SUBCUTANEOUS | 0 refills | Status: DC

## 2021-03-04 MED ORDER — HUMIRA (2 PEN) 40 MG/0.4ML ~~LOC~~ AJKT: 40.0000 mg | AUTO-INJECTOR | SUBCUTANEOUS | 6 refills | Status: DC

## 2021-03-04 NOTE — Telephone Encounter (Signed)
Received a fax from [facility] that states prescription has been approved and sent to [facility] Phone: [phone] Fax: [phone] Case # [id number] Effective 01/31/21-08/29/21  I will also send a new prescription to [facility].

## 2021-03-04 NOTE — Addendum Note (Signed)
Addended by: Timothy Lasso on: 03/04/2021 09:54 AM   Modules accepted: Orders

## 2021-03-21 ENCOUNTER — Other Ambulatory Visit: Payer: Self-pay | Admitting: Gastroenterology

## 2021-03-22 NOTE — Progress Notes (Deleted)
NEW PATIENT Date of Service/Encounter:  03/22/21 Referring provider: {Blank single:19197::"Mansouraty, Valarie Merino Jr.*","none-self referred"} Primary care provider: Pcp, No  Subjective:  Isaiah Carpenter is a 26 y.o. male with a PMHx of  left-sided colitis (likely underlying IBD), GERD (with esophagitis) NSAID anaphylaxis presenting today for evaluation of *** History obtained from: chart review and {Persons; PED relatives w/patient:19415::"patient"}.   Naproxen and ibuprofen: ***  Per GI OV 10/22/20:Interested in aspirin desensitization Other allergy screening: Asthma: {Blank single:19197::"yes","no"} Rhino conjunctivitis: {Blank single:19197::"yes","no"} Food allergy: {Blank single:19197::"yes","no"} Medication allergy: {Blank single:19197::"yes","no"} Hymenoptera allergy: {Blank single:19197::"yes","no"} Urticaria: {Blank single:19197::"yes","no"} Eczema:{Blank single:19197::"yes","no"} History of recurrent infections suggestive of immunodeficency: {Blank single:19197::"yes","no"} ***Vaccinations are up to date.   Past Medical History: Past Medical History:  Diagnosis Date   Anemia    Medication List:  Current Outpatient Medications  Medication Sig Dispense Refill   acetaminophen (TYLENOL) 500 MG tablet Take 500-1,000 mg by mouth every 8 (eight) hours as needed for mild pain (or headaches).     Adalimumab (HUMIRA PEN) 40 MG/0.4ML PNKT Inject 40 mg into the skin every 14 (fourteen) days. 2 each 6   Adalimumab (HUMIRA PEN-CD/UC/HS STARTER) 80 MG/0.8ML PNKT INJECT 160 MG (1.6 ML) UNDER THE SKIN ON DAY 1, THEN INJECT 80 MG (0.8 ML) ON DAY 15 3 each 0   ferrous sulfate 325 (65 FE) MG EC tablet Take 1 tablet (325 mg total) by mouth daily. 60 tablet 0   mercaptopurine (PURINETHOL) 50 MG tablet Take 1.5 tablets by mouth daily. Give on an empty stomach 1 hour before or 2 hours after meals. Caution: Chemotherapy. 90 tablet 2   predniSONE (DELTASONE) 10 MG tablet Take 2 tablets by mouth  once daily. 100 tablet 0   vitamin B-12 (CYANOCOBALAMIN) 1000 MCG tablet Take 1 tablet (1,000 mcg total) by mouth daily. 30 tablet 1   No current facility-administered medications for this visit.   Known Allergies:  Allergies  Allergen Reactions   Aleve [Naproxen Sodium] Anaphylaxis   Feraheme [Ferumoxytol] Shortness Of Breath    Patient had hot flushes and tight ness in ches within 5 minutes of iv infusion   Ibuprofen Anaphylaxis and Swelling   Past Surgical History: Past Surgical History:  Procedure Laterality Date   BIOPSY  08/04/2020   Procedure: BIOPSY;  Surgeon: Irving Copas., MD;  Location: [place];  Service: Gastroenterology;;   COLONOSCOPY     COLONOSCOPY WITH PROPOFOL N/A 08/04/2020   Procedure: COLONOSCOPY;  Surgeon: Irving Copas., MD;  Location: [facility] ENDOSCOPY;  Service: Gastroenterology;  Laterality: N/A;   ESOPHAGOGASTRODUODENOSCOPY N/A 08/04/2020   Procedure: ESOPHAGOGASTRODUODENOSCOPY (EGD);  Surgeon: Irving Copas., MD;  Location: [place];  Service: Gastroenterology;  Laterality: N/A;   UPPER GASTROINTESTINAL ENDOSCOPY     Family History: Family History  Problem Relation Age of Onset   Colon cancer Neg Hx    Esophageal cancer Neg Hx    Stomach cancer Neg Hx    Inflammatory bowel disease Neg Hx    Liver disease Neg Hx    Pancreatic cancer Neg Hx    Rectal cancer Neg Hx    Social History: Jud lives ***.   ROS:  All other systems negative except as noted per HPI.  Objective:  There were no vitals taken for this visit. There is no height or weight on file to calculate BMI. Physical Exam:  General Appearance:  Alert, cooperative, no distress, appears stated age  Head:  Normocephalic, without obvious abnormality, atraumatic  Eyes:  Conjunctiva clear, EOM's intact  Nose: Nares normal, {Blank multiple:19196:a:"***","hypertrophic turbinates","normal mucosa","no visible anterior polyps","septum midline"}  Throat: Lips,  tongue normal; teeth and gums normal, {Blank multiple:19196:a:"***","normal posterior oropharynx","tonsils 2+","tonsils 3+","no tonsillar exudate","+ cobblestoning"}  Neck: Supple, symmetrical  Lungs:   {Blank multiple:19196:a:"***","clear to auscultation bilaterally","end-expiratory wheezing","wheezing throughout"}, Respirations unlabored, {Blank multiple:19196:a:"***","no coughing","intermittent dry coughing"}  Heart:  {Blank multiple:19196:a:"***","regular rate and rhythm","no murmur"}, Appears well perfused  Extremities: No edema  Skin: Skin color, texture, turgor normal, no rashes or lesions on visualized portions of skin  Neurologic: No gross deficits     Diagnostics: Spirometry:  Tracings reviewed. His effort: {Blank single:19197::"Good reproducible efforts.","It was hard to get consistent efforts and there is a question as to whether this reflects a maximal maneuver.","Poor effort, data can not be interpreted.","Variable effort-results affected.","decent for first attempt at spirometry."} FVC: ***L (pre), ***L  (post) FEV1: ***L, ***% predicted (pre), ***L, ***% predicted (post) FEV1/FVC ratio: ***% (pre), ***% (post) Interpretation: {Blank single:19197::"Spirometry consistent with mild obstructive disease","Spirometry consistent with moderate obstructive disease","Spirometry consistent with severe obstructive disease","Spirometry consistent with possible restrictive disease","Spirometry consistent with mixed obstructive and restrictive disease","Spirometry uninterpretable due to technique","Spirometry consistent with normal pattern","No overt abnormalities noted given today's efforts"} with *** bronchodilator response  Skin Testing: {Blank single:19197::"Select foods","Environmental allergy panel","Environmental allergy panel and select foods","Food allergy panel","None","Deferred due to recent antihistamines use"}. *** Adequate controls. Results discussed with  patient/family.   {Blank single:19197::"Allergy testing results were read and interpreted by myself, documented by clinical staff."," "}  Assessment and Plan  There are no Patient Instructions on file for this visit.  {Blank single:19197::"This note in its entirety was forwarded to the Provider who requested this consultation."}  Thank you for your kind referral. I appreciate the opportunity to take part in Gottlieb's care. Please do not hesitate to contact me with questions.***  Sincerely,  Sigurd Sos, MD [facility]

## 2021-03-24 ENCOUNTER — Ambulatory Visit: Payer: Self-pay | Admitting: Internal Medicine

## 2021-03-25 ENCOUNTER — Encounter: Payer: Self-pay | Admitting: Gastroenterology

## 2021-03-28 ENCOUNTER — Encounter: Payer: Self-pay | Admitting: Gastroenterology

## 2021-03-29 NOTE — Telephone Encounter (Signed)
[name], Lets see if we can get the patient in to clinic with me at 3:50 PM tomorrow.  If that does not work, please get him scheduled next available with one of the APP's or myself.  If he cannot make that clinic but can come in for labs then I would do the following labs CBC/CMP/ESR/CRP/TSH/prealbumin. Let me know what his steroid dosing is at as well. Thanks. GM

## 2021-03-30 ENCOUNTER — Ambulatory Visit (INDEPENDENT_AMBULATORY_CARE_PROVIDER_SITE_OTHER): Payer: BC Managed Care – PPO | Admitting: Gastroenterology

## 2021-03-30 ENCOUNTER — Encounter: Payer: Self-pay | Admitting: Gastroenterology

## 2021-03-30 ENCOUNTER — Other Ambulatory Visit (INDEPENDENT_AMBULATORY_CARE_PROVIDER_SITE_OTHER): Payer: BC Managed Care – PPO

## 2021-03-30 VITALS — BP 84/66 | HR 108 | Ht 66.75 in | Wt 149.0 lb

## 2021-03-30 DIAGNOSIS — M2550 Pain in unspecified joint: Secondary | ICD-10-CM | POA: Diagnosis not present

## 2021-03-30 DIAGNOSIS — T380X5A Adverse effect of glucocorticoids and synthetic analogues, initial encounter: Secondary | ICD-10-CM

## 2021-03-30 DIAGNOSIS — R1013 Epigastric pain: Secondary | ICD-10-CM | POA: Diagnosis not present

## 2021-03-30 DIAGNOSIS — K51919 Ulcerative colitis, unspecified with unspecified complications: Secondary | ICD-10-CM

## 2021-03-30 DIAGNOSIS — R635 Abnormal weight gain: Secondary | ICD-10-CM

## 2021-03-30 DIAGNOSIS — D84821 Immunodeficiency due to drugs: Secondary | ICD-10-CM

## 2021-03-30 DIAGNOSIS — R630 Anorexia: Secondary | ICD-10-CM

## 2021-03-30 DIAGNOSIS — Z7952 Long term (current) use of systemic steroids: Secondary | ICD-10-CM

## 2021-03-30 DIAGNOSIS — D500 Iron deficiency anemia secondary to blood loss (chronic): Secondary | ICD-10-CM

## 2021-03-30 LAB — COMPREHENSIVE METABOLIC PANEL
ALT: 12 U/L (ref 0–53)
AST: 5 U/L (ref 0–37)
Albumin: 3.4 g/dL — ABNORMAL LOW (ref 3.5–5.2)
Alkaline Phosphatase: 65 U/L (ref 39–117)
BUN: 6 mg/dL (ref 6–23)
CO2: 30 mEq/L (ref 19–32)
Calcium: 9.2 mg/dL (ref 8.4–10.5)
Chloride: 99 mEq/L (ref 96–112)
Creatinine, Ser: 0.86 mg/dL (ref 0.40–1.50)
GFR: 121.67 mL/min (ref >60.00)
Glucose, Bld: 100 mg/dL — ABNORMAL HIGH (ref 70–99)
Potassium: 3 mEq/L — ABNORMAL LOW (ref 3.5–5.1)
Sodium: 134 mEq/L — ABNORMAL LOW (ref 135–145)
Total Bilirubin: 0.3 mg/dL (ref 0.2–1.2)
Total Protein: 6.6 g/dL (ref 6.0–8.3)

## 2021-03-30 LAB — C-REACTIVE PROTEIN: CRP: 10.5 mg/dL (ref 0.5–20.0)

## 2021-03-30 LAB — LIPASE: Lipase: 11 U/L (ref 11.0–59.0)

## 2021-03-30 LAB — CORTISOL: Cortisol, Plasma: 8.7 ug/dL

## 2021-03-30 LAB — CBC
HCT: 32.6 % — ABNORMAL LOW (ref 39.0–52.0)
Hemoglobin: 9.9 g/dL — ABNORMAL LOW (ref 13.0–17.0)
MCHC: 30.2 g/dL (ref 30.0–36.0)
MCV: 72.8 fl — ABNORMAL LOW (ref 78.0–100.0)
Platelets: 652 10*3/uL — ABNORMAL HIGH (ref 150.0–400.0)
RBC: 4.48 Mil/uL (ref 4.22–5.81)
RDW: 19.8 % — ABNORMAL HIGH (ref 11.5–15.5)
WBC: 14.7 10*3/uL — ABNORMAL HIGH (ref 4.0–10.5)

## 2021-03-30 LAB — SEDIMENTATION RATE: Sed Rate: 75 mm/hr — ABNORMAL HIGH (ref 0–15)

## 2021-03-30 LAB — AMYLASE: Amylase: 13 U/L — ABNORMAL LOW (ref 27–131)

## 2021-03-30 MED ORDER — PANTOPRAZOLE SODIUM 20 MG PO TBEC: 20.0000 mg | DELAYED_RELEASE_TABLET | Freq: Every day | ORAL | 2 refills | Status: DC

## 2021-03-30 MED ORDER — PREDNISONE 20 MG PO TABS: ORAL_TABLET | ORAL | 0 refills | Status: DC

## 2021-03-30 NOTE — Progress Notes (Signed)
GASTROENTEROLOGY OUTPATIENT CLINIC VISIT   Primary Care Provider Pcp, No No address on file None   Patient Profile: Isaiah Carpenter is a 26 y.o. male with a pmh significant for at least left-sided colitis (likely UC on steroids and 6MP and now Humira induction), GERD (with esophagitis), NSAID anaphylaxis, IDA.  The patient presents to the [facility] for an evaluation and management of problem(s) noted below:  Problem List 1. Decreased appetite   2. Abdominal pain, epigastric   3. Ulcerative colitis with complication, unspecified location ([place])   4. 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 Arthralgia, unspecified joint   6. Weight gain   7. Iron deficiency anemia due to chronic blood loss     History of Present Illness Please see prior progress notes for full details of HPI.  Interval History The patient is seen today for earlier follow-up.  He was scheduled to see [place] in the next couple of weeks but he has had some issues occur.  He finally was able to be initiated on Humira and had his 160 mg dose on 1/21 and then had his 80 mg dose on 2/4.  Within a few days of the initiation of his Humira, he began to notice issues of new joint pains in his wrists and knees and legs.  He began to experience some increased amount of headaches as well as nausea and increased fatigue.  He also has developed a newer mid epigastric abdominal discomfort that improves somewhat when eating.  His weight has been stable.  He is having to wake up at night to have a bowel movement however when things had been previously better.  He has been out of prednisone for the last week and a half rather than continued the oral steroids until his Humira induction completed.  He had not let us know about that however.  He remains at his current job and feels good about things but the recent symptoms have become more symptoms here for him that it is causing more pressure at work for him.  He  has an upcoming allergy appointment to see about nonsteroidals/aspirin desensitization.  He has had some dizziness when he wakes up in the morning or stands up quickly.  BMs per day -2-4 Nocturnal BMs -at least waking up once a night Blood -none Mucous -not frequent Tenesmus -none Urgency -with less than half of his bowel movements Skin Manifestations -denies Eye Manifestations -denies Joint Manifestations -denies  GI Review of Systems Positive as above  Negative for dysphagia, odynophagia, vomiting, melena  Review of Systems General: Denies fevers/chills HEENT: Denies oral lesions Cardiovascular: Denies chest pain Pulmonary: Denies shortness of breath Gastroenterological: See HPI Genitourinary: Denies darkened urine Hematological: Denies easy bruising/bleeding Dermatological: Denies jaundice or skin nodules or new rashes Psychological: Mood is stable Allergy & Immunology: We rediscussed NSAID anaphylaxis Musculoskeletal: Arthralgias as noted above in HPI   Medications Current Outpatient Medications  Medication Sig Dispense Refill   acetaminophen (TYLENOL) 500 MG tablet Take 500-1,000 mg by mouth every 8 (eight) hours as needed for mild pain (or headaches).     Adalimumab (HUMIRA PEN) 40 MG/0.4ML PNKT Inject 40 mg into the skin every 14 (fourteen) days. 2 each 6   ferrous sulfate 325 (65 FE) MG EC tablet Take 1 tablet (325 mg total) by mouth daily. 60 tablet 0   mercaptopurine (PURINETHOL) 50 MG tablet Take 1.5 tablets by mouth daily. Give on an empty stomach 1 hour before or 2  hours after meals. Caution: Chemotherapy. 90 tablet 2   pantoprazole (PROTONIX) 20 MG tablet Take 1 tablet (20 mg total) by mouth daily. 30 tablet 2   predniSONE (DELTASONE) 20 MG tablet Take 2 tablets (40 mg total) by mouth daily with breakfast for 7 days, THEN 1.5 tablets (30 mg total) daily with breakfast for 7 days, THEN 1 tablet (20 mg total) daily with breakfast. 54.5 tablet 0   vitamin B-12  (CYANOCOBALAMIN) 1000 MCG tablet Take 1 tablet (1,000 mcg total) by mouth daily. 30 tablet 1   No current facility-administered medications for this visit.    Allergies Allergies  Allergen Reactions   Aleve [Naproxen Sodium] Anaphylaxis   Feraheme [Ferumoxytol] Shortness Of Breath    Patient had hot flushes and tight ness in ches within 5 minutes of iv infusion   Ibuprofen Anaphylaxis and Swelling    Histories Past Medical History:  Diagnosis Date   Anemia    Past Surgical History:  Procedure Laterality Date   BIOPSY  08/04/2020   Procedure: BIOPSY;  Surgeon: Irving Copas., MD;  Location: [place];  Service: Gastroenterology;;   COLONOSCOPY     COLONOSCOPY WITH PROPOFOL N/A 08/04/2020   Procedure: COLONOSCOPY;  Surgeon: Irving Copas., MD;  Location: [facility] ENDOSCOPY;  Service: Gastroenterology;  Laterality: N/A;   ESOPHAGOGASTRODUODENOSCOPY N/A 08/04/2020   Procedure: ESOPHAGOGASTRODUODENOSCOPY (EGD);  Surgeon: Irving Copas., MD;  Location: [place];  Service: Gastroenterology;  Laterality: N/A;   UPPER GASTROINTESTINAL ENDOSCOPY     Social History   Socioeconomic History   Marital status: Single    Spouse name: Not on file   Number of children: Not on file   Years of education: Not on file   Highest education level: Not on file  Occupational History   Not on file  Tobacco Use   Smoking status: Former    Packs/day: 0.50    Types: Cigarettes   Smokeless tobacco: Never  Vaping Use   Vaping Use: Some days  Substance and Sexual Activity   Alcohol use: Yes    Comment: rarely   Drug use: Never   Sexual activity: Yes  Other Topics Concern   Not on file  Social History Narrative   Not on file   Social Determinants of Health   Financial Resource Strain: Not on file  Food Insecurity: Not on file  Transportation Needs: Not on file  Physical Activity: Not on file  Stress: Not on file  Social Connections: Not on file  Intimate  Partner Violence: Not on file   Family History  Problem Relation Age of Onset   Colon cancer Neg Hx    Esophageal cancer Neg Hx    Stomach cancer Neg Hx    Inflammatory bowel disease Neg Hx    Liver disease Neg Hx    Pancreatic cancer Neg Hx    Rectal cancer Neg Hx    I have reviewed his medical, social, and family history in detail and updated the electronic medical record as necessary.    PHYSICAL EXAMINATION  BP (!) 84/66 (BP Location: Left Arm, Patient Position: Sitting, Cuff Size: Normal)    Pulse (!) 108    Ht 5' 6.75" (1.695 m) Comment: height measured without shoes   Wt 149 lb (67.6 kg)    BMI 23.51 kg/m  Wt Readings from Last 3 Encounters:  03/30/21 149 lb (67.6 kg)  02/03/21 156 lb (70.8 kg)  12/03/20 128 lb 8 oz (58.3 kg)  GEN: NAD, appears stated  age, doesn't appear chronically ill PSYCH: Cooperative, without pressured speech EYE: Conjunctivae pink, sclerae anicteric ENT: MMM, no oral ulcers or lesions CV: Nontachycardic RESP: CTAB posteriorly, without wheezing GI: NABS, soft, rounded abdomen, TTP in MEG, nontender, nondistended, without rebound or guarding MSK/EXT: No lower extremity edema SKIN: No jaundice, no concerning rashes NEURO:  Alert & Oriented x 3, no focal deficits   REVIEW OF DATA  I reviewed the following data at the time of this encounter:  GI Procedures and Studies  Previously reviewed  Laboratory Studies  Reviewed those in epic  Imaging Studies  No new studies to review   ASSESSMENT  Mr. Leser is a 26 y.o. male with a pmh significant for at least left-sided colitis (likely UC on steroids and 6MP and now Humira induction), GERD (with esophagitis), NSAID anaphylaxis, IDA.   The patient is seen today for evaluation and management of:  1. Decreased appetite   2. Abdominal pain, epigastric   3. Ulcerative colitis with complication, unspecified location ([place])   4. 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 Arthralgia,  unspecified joint   6. Weight gain   7. Iron deficiency anemia due to chronic blood loss    The patient is hemodynamically stable.  However, clinically he is not doing as well even though we have been able to initiate Humira.  I think some of his symptoms could be a result of his Humira in regards to some of the arthralgias but his decreased appetite is lightheadedness and other symptoms are concerning to me that he may have some adrenal insufficiency from having come off his prednisone as well as from him not being able to complete his Humira induction completely.  He certainly could have inability to tolerate Humira but it would be quite quick to develop antibodies.  I would wait to check on these unless he symptomatically does not improve with the reinitiation of steroids.  I am going to check his electrolytes as well as his inflammatory markers to see where things stand and ensure that he has not progressed in regards to anemia could be causing some symptoms as well.  He is at risk of gastritis in the setting of the long-term steroid use and he is not taking PPI so we are going to reinitiate a PPI daily.  We will reevaluate where things are in the next few weeks and hopefully he has been able to tolerate the completion of his induction therapy and will be doing well.  Appreciate him being able to be seen by our allergy specialist for consideration of NSAID/aspirin desensitization if possible.  All patient questions were answered to the best of my ability, and the patient agrees to the aforementioned plan of action with follow-up as indicated.   PLAN  Laboratories as outlined below Restart prednisone - 40 mg x 1 week - 30 mg x 1 week - 20 mg and maintain until he is seen in clinic - If he is running out he needs to let us know as soon as possible Continue 6-MP at 75 mg daily for now - Consider TPMT evaluation pending how things look on his labs Continue Humira induction Restart PPI daily KUB to  be obtained today Cortisol to be obtained Appreciate allergy evaluation in the coming weeks If patient remains anemic will likely need IV iron    Orders Placed This Encounter  Procedures   DG Abd 2 Views   CBC   Comp Met (CMET)   Amylase  Lipase   Cortisol   Sedimentation rate   C-reactive protein     New Prescriptions   PANTOPRAZOLE (PROTONIX) 20 MG TABLET    Take 1 tablet (20 mg total) by mouth daily.   PREDNISONE (DELTASONE) 20 MG TABLET    Take 2 tablets (40 mg total) by mouth daily with breakfast for 7 days, THEN 1.5 tablets (30 mg total) daily with breakfast for 7 days, THEN 1 tablet (20 mg total) daily with breakfast.   Modified Medications   No medications on file    Planned Follow Up No follow-ups on file.   Total Time in Face-to-Face and in Coordination of Care for patient including independent/personal interpretation/review of prior testing, medical history, examination, medication adjustment, communicating results with the patient directly, and documentation with the EHR is 30 minutes.   Justice Britain, MD [facility] Office # [phone]

## 2021-03-30 NOTE — Patient Instructions (Addendum)
Your provider has requested that you go to the basement level for lab work before leaving today. Press "B" on the elevator. The lab is located at the first door on the left as you exit the elevator.   We have sent the following medications to your pharmacy for you to pick up at your convenience: Pantoprazole  Start Pantoprazole   Continue Humira  Taper Prednisone as directed. Once taper down to 20mg  once daily, stay on 20mg  daily until your next visit in 4 weeks.    Follow-up on 04/27/21 at 3:30pm  If you are age 24 or younger, your body mass index should be between 19-25. Your Body mass index is 23.51 kg/m. If this is out of the aformentioned range listed, please consider follow up with your Primary Care Provider.   ________________________________________________________  The [facility] providers would like to encourage you to use [facility] to communicate with providers for non-urgent requests or questions.  Due to long hold times on the telephone, sending your provider a message by [facility] may be a faster and more efficient way to get a response.  Please allow 48 business hours for a response.  Please remember that this is for non-urgent requests.  _______________________________________________________  Due to recent changes in healthcare laws, you may see the results of your imaging and laboratory studies on MyChart before your provider has had a chance to review them.  We understand that in some cases there may be results that are confusing or concerning to you. Not all laboratory results come back in the same time frame and the provider may be waiting for multiple results in order to interpret others.  Please give [place] 48 hours in order for your provider to thoroughly review all the results before contacting the office for clarification of your results.      Thank you for choosing me and [facility].  Dr. Rush Landmark

## 2021-03-31 ENCOUNTER — Other Ambulatory Visit: Payer: Self-pay

## 2021-03-31 ENCOUNTER — Other Ambulatory Visit (INDEPENDENT_AMBULATORY_CARE_PROVIDER_SITE_OTHER): Payer: BC Managed Care – PPO

## 2021-03-31 DIAGNOSIS — D649 Anemia, unspecified: Secondary | ICD-10-CM | POA: Diagnosis not present

## 2021-03-31 DIAGNOSIS — M2550 Pain in unspecified joint: Secondary | ICD-10-CM | POA: Insufficient documentation

## 2021-03-31 DIAGNOSIS — R630 Anorexia: Secondary | ICD-10-CM | POA: Insufficient documentation

## 2021-03-31 DIAGNOSIS — R1013 Epigastric pain: Secondary | ICD-10-CM | POA: Insufficient documentation

## 2021-03-31 DIAGNOSIS — D500 Iron deficiency anemia secondary to blood loss (chronic): Secondary | ICD-10-CM

## 2021-03-31 LAB — IBC + FERRITIN
Ferritin: 119.8 ng/mL (ref 22.0–322.0)
Iron: 16 ug/dL — ABNORMAL LOW (ref 42–165)
Saturation Ratios: 6.3 % — ABNORMAL LOW (ref 20.0–50.0)
TIBC: 253.4 ug/dL (ref 250.0–450.0)
Transferrin: 181 mg/dL — ABNORMAL LOW (ref 212.0–360.0)

## 2021-03-31 LAB — VITAMIN B12: Vitamin B-12: 950 pg/mL — ABNORMAL HIGH (ref 211–911)

## 2021-03-31 MED ORDER — POTASSIUM CHLORIDE CRYS ER 20 MEQ PO TBCR: 20.0000 meq | EXTENDED_RELEASE_TABLET | Freq: Two times a day (BID) | ORAL | 0 refills | Status: DC

## 2021-04-07 ENCOUNTER — Ambulatory Visit: Payer: Self-pay | Admitting: Gastroenterology

## 2021-04-20 ENCOUNTER — Other Ambulatory Visit: Payer: Self-pay

## 2021-04-20 MED ORDER — HUMIRA (2 PEN) 40 MG/0.4ML ~~LOC~~ AJKT: 40.0000 mg | AUTO-INJECTOR | SUBCUTANEOUS | 6 refills | Status: DC

## 2021-04-27 ENCOUNTER — Encounter: Payer: Self-pay | Admitting: Gastroenterology

## 2021-04-27 ENCOUNTER — Ambulatory Visit (INDEPENDENT_AMBULATORY_CARE_PROVIDER_SITE_OTHER): Payer: BC Managed Care – PPO | Admitting: Gastroenterology

## 2021-04-27 VITALS — BP 122/64 | HR 88 | Ht 67.0 in | Wt 153.4 lb

## 2021-04-27 DIAGNOSIS — Z886 Allergy status to analgesic agent status: Secondary | ICD-10-CM

## 2021-04-27 DIAGNOSIS — R1013 Epigastric pain: Secondary | ICD-10-CM

## 2021-04-27 DIAGNOSIS — R630 Anorexia: Secondary | ICD-10-CM | POA: Diagnosis not present

## 2021-04-27 DIAGNOSIS — M2550 Pain in unspecified joint: Secondary | ICD-10-CM

## 2021-04-27 DIAGNOSIS — D500 Iron deficiency anemia secondary to blood loss (chronic): Secondary | ICD-10-CM | POA: Diagnosis not present

## 2021-04-27 DIAGNOSIS — K51919 Ulcerative colitis, unspecified with unspecified complications: Secondary | ICD-10-CM

## 2021-04-27 MED ORDER — PREDNISONE 10 MG PO TABS: ORAL_TABLET | ORAL | 0 refills | Status: AC

## 2021-04-27 NOTE — Patient Instructions (Addendum)
We have sent the following medications to your pharmacy for you to pick up at your convenience: ?Prednisone  ? ?Your provider has requested that you go to the basement level for lab work on 05/07/2021. Press "B" on the elevator. The lab is located at the first door on the left as you exit the elevator. ? ?Follow-up on: 06/03/21 at 3:30pm ? ?Thank you for choosing me and [facility]. ? ?Dr. Rush Landmark ? ?

## 2021-04-27 NOTE — Progress Notes (Signed)
GASTROENTEROLOGY OUTPATIENT CLINIC VISIT   Primary Care Provider Pcp, No No address on file None   Patient Profile: Isaiah Carpenter is a 26 y.o. male with a pmh significant for at least left-sided colitis (likely UC on steroids and 6MP and now Humira), GERD (with esophagitis), NSAID anaphylaxis, IDA.  The patient presents to the [facility] for an evaluation and management of problem(s) noted below:  Problem List 1. Ulcerative colitis with complication, unspecified location ([place])   2. Abdominal pain, epigastric   3. Iron deficiency anemia due to chronic blood loss   4. Decreased appetite   5. Arthralgia, unspecified joint   6. Allergy to nonsteroidal anti-inflammatory drug (NSAID)     History of Present Illness Please see prior notes for full details of HPI.  Interval History Patient is seen for scheduled follow-up.  After restart of his steroid with our concern for potential noncompletion of induction and too quick of a steroid taper potentially leading to a partial adrenal insufficiency, the patient has been doing better.  Arthralgias are no longer present.  He still has epigastric discomfort at times.  But he is functioning.  He has a new job that is better for him overall and less stressful.  He has completed his Humira induction.  He remains on 20 mg of prednisone.  His next Humira is due on the 18th.    BMs per day -2-3 Nocturnal BMs -waking up at night infrequently Blood -none Mucous -not frequent Tenesmus -none Urgency -with less than half of his bowel movements Skin Manifestations -denies Eye Manifestations -denies Joint Manifestations -denies  GI Review of Systems Positive as above  Negative for odynophagia, dysphagia, nausea, vomiting, melena, hematochezia  Review of Systems General: Denies fevers/chills HEENT: Denies oral lesions Cardiovascular: Denies chest pain Pulmonary: Denies shortness of breath Gastroenterological: See  HPI Genitourinary: Denies darkened urine Hematological: Denies easy bruising/bleeding Dermatological: Denies jaundice or skin nodules or new rashes Psychological: Mood is stable Allergy & Immunology: He will be seeing allergy soon Musculoskeletal: Arthralgias as noted above in HPI   Medications Current Outpatient Medications  Medication Sig Dispense Refill   acetaminophen (TYLENOL) 500 MG tablet Take 500-1,000 mg by mouth every 8 (eight) hours as needed for mild pain (or headaches).     Adalimumab (HUMIRA PEN) 40 MG/0.4ML PNKT Inject 40 mg into the skin every 14 (fourteen) days. 2 each 6   mercaptopurine (PURINETHOL) 50 MG tablet Take 1.5 tablets by mouth daily. Give on an empty stomach 1 hour before or 2 hours after meals. Caution: Chemotherapy. 90 tablet 2   pantoprazole (PROTONIX) 20 MG tablet Take 1 tablet (20 mg total) by mouth daily. 30 tablet 2   [START ON 05/08/2021] predniSONE (DELTASONE) 10 MG tablet Take 2 tablets (20 mg total) by mouth every 14 (fourteen) days for 14 days, THEN 1.5 tablets (15 mg total) every 14 (fourteen) days for 14 days, THEN 1 tablet (10 mg total) every 14 (fourteen) days for 14 days, THEN 0.5 tablets (5 mg total) every 14 (fourteen) days for 14 days. 60 tablet 0   ferrous sulfate 325 (65 FE) MG EC tablet Take 1 tablet (325 mg total) by mouth daily. 60 tablet 0   No current facility-administered medications for this visit.    Allergies Allergies  Allergen Reactions   Aleve [Naproxen Sodium] Anaphylaxis   Feraheme [Ferumoxytol] Shortness Of Breath    Patient had hot flushes and tight ness in ches within 5 minutes of iv infusion   Ibuprofen  Anaphylaxis and Swelling    Histories Past Medical History:  Diagnosis Date   Anemia    Past Surgical History:  Procedure Laterality Date   BIOPSY  08/04/2020   Procedure: BIOPSY;  Surgeon: Rush Landmark Telford Nab., MD;  Location: [place];  Service: Gastroenterology;;   COLONOSCOPY     COLONOSCOPY WITH  PROPOFOL N/A 08/04/2020   Procedure: COLONOSCOPY;  Surgeon: Irving Copas., MD;  Location: [place];  Service: Gastroenterology;  Laterality: N/A;   ESOPHAGOGASTRODUODENOSCOPY N/A 08/04/2020   Procedure: ESOPHAGOGASTRODUODENOSCOPY (EGD);  Surgeon: Irving Copas., MD;  Location: [place];  Service: Gastroenterology;  Laterality: N/A;   UPPER GASTROINTESTINAL ENDOSCOPY     Social History   Socioeconomic History   Marital status: Single    Spouse name: Not on file   Number of children: Not on file   Years of education: Not on file   Highest education level: Not on file  Occupational History   Not on file  Tobacco Use   Smoking status: Former    Packs/day: 0.50    Types: Cigarettes   Smokeless tobacco: Never  Vaping Use   Vaping Use: Some days  Substance and Sexual Activity   Alcohol use: Yes    Comment: rarely   Drug use: Never   Sexual activity: Yes  Other Topics Concern   Not on file  Social History Narrative   Not on file   Social Determinants of Health   Financial Resource Strain: Not on file  Food Insecurity: Not on file  Transportation Needs: Not on file  Physical Activity: Not on file  Stress: Not on file  Social Connections: Not on file  Intimate Partner Violence: Not on file   Family History  Problem Relation Age of Onset   Colon cancer Neg Hx    Esophageal cancer Neg Hx    Stomach cancer Neg Hx    Inflammatory bowel disease Neg Hx    Liver disease Neg Hx    Pancreatic cancer Neg Hx    Rectal cancer Neg Hx    I have reviewed his medical, social, and family history in detail and updated the electronic medical record as necessary.    PHYSICAL EXAMINATION  BP 122/64 (BP Location: Left Arm, Patient Position: Sitting, Cuff Size: Normal)    Pulse 88    Ht '5\' 7"'  (1.702 m)    Wt 153 lb 6 oz (69.6 kg)    SpO2 99%    BMI 24.02 kg/m  Wt Readings from Last 3 Encounters:  04/27/21 153 lb 6 oz (69.6 kg)  03/30/21 149 lb (67.6 kg)   02/03/21 156 lb (70.8 kg)  GEN: NAD, appears stated age, doesn't appear chronically ill, his nephew is with him today PSYCH: Cooperative, without pressured speech EYE: Conjunctivae pink, sclerae anicteric ENT: MMM, no oral ulcers or lesions CV: Nontachycardic RESP: CTAB posteriorly, without wheezing GI: NABS, soft, rounded abdomen, mild TTP in MEG, nondistended, without rebound or guarding MSK/EXT: No lower extremity edema SKIN: No jaundice, no concerning rashes NEURO:  Alert & Oriented x 3, no focal deficits   REVIEW OF DATA  I reviewed the following data at the time of this encounter:  GI Procedures and Studies  Previously reviewed  Laboratory Studies  Reviewed those in epic  Imaging Studies  No new studies to review   ASSESSMENT  Mr. Russett is a 26 y.o. male with a pmh significant for at least left-sided colitis (likely UC on steroids and 6MP and now Humira), GERD (  with esophagitis), NSAID anaphylaxis, IDA.  The patient is seen today for evaluation and management of:  1. Ulcerative colitis with complication, unspecified location ([place])   2. Abdominal pain, epigastric   3. Iron deficiency anemia due to chronic blood loss   4. Decreased appetite   5. Arthralgia, unspecified joint   6. Allergy to nonsteroidal anti-inflammatory drug (NSAID)    The patient is hemodynamically and clinically stable.  He has completed his Humira induction.  Many of his symptoms have improved.  We are going to do therapeutic drug monitoring with his next Humira injection, next week (he will come in on Friday for labs).  We will see how his iron deficiency looks as well and consider IV iron if need be.  Hopefully we will be able to stave off needing to switch medications.  If develop antibodies however then we will need to consider that.  We will also consider repeat endoscopic evaluation to see if mucosal healing is occurring or not and perform a completion colonoscopy at some point this year.  All  patient questions were answered to the best of my ability, and the patient agrees to the aforementioned plan of action with follow-up as indicated.   PLAN  Laboratories as outlined below to be drawn next week Humira drug level and antibody level to be drawn next week on Friday (day prior to his injection) Continue prednisone taper - 20 mg until March 18 then - 15 mg x 2 weeks - 10 mg x 2 weeks - 5 mg x 2 weeks - We will decide at follow-up clinic visit what to do in regards to final tapering Continue 6-MP at 75 mg daily for now -TPMT metabolites to be obtained Continue Humira every 2 weeks  Colonoscopy this near to complete evaluation Continue PPI daily Appreciate allergy evaluation in the coming weeks If patient remains anemic will likely need IV iron    Orders Placed This Encounter  Procedures   CBC   Comp Met (CMET)   Sedimentation rate   C-reactive protein   Adalimumab+Ab (Serial Monitor)     New Prescriptions   PREDNISONE (DELTASONE) 10 MG TABLET    Take 2 tablets (20 mg total) by mouth every 14 (fourteen) days for 14 days, THEN 1.5 tablets (15 mg total) every 14 (fourteen) days for 14 days, THEN 1 tablet (10 mg total) every 14 (fourteen) days for 14 days, THEN 0.5 tablets (5 mg total) every 14 (fourteen) days for 14 days.   Modified Medications   No medications on file    Planned Follow Up No follow-ups on file.   Total Time in Face-to-Face and in Coordination of Care for patient including independent/personal interpretation/review of prior testing, medical history, examination, medication adjustment, communicating results with the patient directly, and documentation with the EHR is 25 minutes.   Justice Britain, MD [facility] Office # [phone]

## 2021-04-28 ENCOUNTER — Encounter: Payer: Self-pay | Admitting: Gastroenterology

## 2021-05-06 ENCOUNTER — Other Ambulatory Visit: Payer: Self-pay

## 2021-05-06 ENCOUNTER — Encounter: Payer: Self-pay | Admitting: Allergy & Immunology

## 2021-05-06 ENCOUNTER — Ambulatory Visit: Payer: BC Managed Care – PPO | Admitting: Allergy & Immunology

## 2021-05-06 VITALS — BP 120/76 | HR 120 | Temp 98.0°F | Resp 18 | Ht 67.0 in | Wt 155.2 lb

## 2021-05-06 DIAGNOSIS — K51919 Ulcerative colitis, unspecified with unspecified complications: Secondary | ICD-10-CM

## 2021-05-06 DIAGNOSIS — D84821 Immunodeficiency due to drugs: Secondary | ICD-10-CM | POA: Diagnosis not present

## 2021-05-06 DIAGNOSIS — Z7952 Long term (current) use of systemic steroids: Secondary | ICD-10-CM | POA: Diagnosis not present

## 2021-05-06 DIAGNOSIS — T50905A Adverse effect of unspecified drugs, medicaments and biological substances, initial encounter: Secondary | ICD-10-CM

## 2021-05-06 NOTE — Progress Notes (Signed)
? ?NEW PATIENT ? ?Date of Service/Encounter:  05/06/21 ? ?Consult requested by: Mansouraty, Netty StarringGabriel Jr., MD ? ? ?Assessment:  ? ?Adverse effect of drug (NSAID) - planning for challenge ? ?Ulcerative colitis with complication - on Humira and prednisone ? ?Immunosuppression due to chronic steroid use - no live vaccinations until off of prednisone for one month or longer ? ? ?Isaiah Carpenter is going to come in for an ibuprofen challenge. I think that if he tolerates this challenge, he should be good to receive the sulfasalazine. We could even give his first dose in the office if needed to provide more reassurance. If he fails the ibuprofen challenge, I still would like him to come in for a sulfasalazine challenge since the cross reactivity is not 100% in every patient. We could do an inpatient desensitization as well and I did discuss this with Isaiah Carpenter and he was open to doing this if needed.  ? ?Plan/Recommendations:  ? ?1. Adverse effect of drug ?- There is no validated testing to most drugs. ?- Therefore the best step is to probably do an NSAID challenge. ?- Make an appointment for that on your way out. ?- I am going to check with Dr. Meridee ScoreMansouraty about the long term plan as well to make sure that we are on the same page. ? ?2. Return in about 4 weeks (around 06/03/2021) for IBUPROFEN CHALLENGE.  ? ? ? ?This note in its entirety was forwarded to the Provider who requested this consultation. ? ?Subjective:  ? ?Isaiah Carpenter is a 26 y.o. male presenting today for evaluation of  ?Chief Complaint  ?Patient presents with  ? Establish Care  ? ? ?Isaiah Carpenter has a history of the following: ?Patient Active Problem List  ? Diagnosis Date Noted  ? Arthralgia 03/31/2021  ? Abdominal pain, epigastric 03/31/2021  ? Decreased appetite 03/31/2021  ? Anemia 02/04/2021  ? Weight gain 02/04/2021  ? Ulcerative colitis with complication (HCC) 12/05/2020  ? Immunosuppression due to chronic steroid use (HCC) 12/05/2020  ? Underinsured  12/05/2020  ? Gastroesophageal reflux disease with esophagitis without hemorrhage 12/05/2020  ? Gastroesophageal reflux disease 10/22/2020  ? Tenesmus 10/22/2020  ? Rectal bleeding 10/22/2020  ? Chronic diarrhea 10/22/2020  ? Left sided colitis with rectal bleeding (HCC) 10/22/2020  ? Allergy to nonsteroidal anti-inflammatory drug (NSAID) 10/22/2020  ? Esophagitis 08/07/2020  ? Duodenitis determined by biopsy 08/07/2020  ? 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 Vitamin D deficiency 08/07/2020  ? Thrombocytosis 08/07/2020  ? Proctitis 08/03/2020  ? Iron deficiency anemia due to chronic blood loss 08/03/2020  ? Hyponatremia 08/03/2020  ? ? ?History obtained from: chart review and patient. ? ?Isaiah Carpenter was referred by Mansouraty, Netty StarringGabriel Jr., MD.    ? ?Isaiah Carpenter is a 26 y.o. male presenting for an evaluation of NSAID allergy . ? ?He has a history of ulcerative colitis.  He is followed by Dr. Meridee ScoreMansouraty.  At his last visit in March 2023, he was doing better.  He was on Humira.  Symptoms had improved on this medication.  He was continued on a very prolonged prednisone taper.  His anemia was stable on oral iron supplementation.  He developed shortness of breath from iron infusions in the hospital. ? ?Per the GI note from September 2022, Dr. Meridee ScoreMansouraty wanted to rule out an NSAID allergy in case they had to switch to sulfasalazine products.  ? ?His symptoms started in 2021. 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 He then worked at [employer]. He  felt like this happened suddenly. He started having stomach pains around Thanksgiving. In June 2022, he continued to have stomach pain and he was diagnosed with possible ulcerative colitis. He has received a four doses of the Humira. He thinks that this is working but he is still early in the process. Evidently he weaned his prednisone too quickly. Ion fact, he stopped his prednisone right when he started taking the Humira. He did get some joint pains with initiation of the Humira.   Otherwise, he has no other sequelae associated with ulcerative colitis. ? 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  This is helped his symptoms to be more manageable.  ? ?[place] reports that he developed an NSAID allergy around the age of 60. This was within 30 minutes. He was playing a lot of sports at that age and had a lot of inflammation. He took some ibuprofen and he ended up in anaphylactic shock. He went to the hospital and was diagnosed with an ibuprofen allergy. He did go to the ED. He thinks that he received an EpiPen. He did carry an EpiPen for a period of time, but it expired and he never used it anyway. He then tried Aleve and Advil and he reacted to those as well. This was in [place] somewhere.  ? ?He can take Tylenol. He has not taken aspirin to his knowledge. He is very leery about taking medications in general. Tylenol works well when he needs it for pain management.  ? ?He otherwise has no atopic history at all. There is no atopic family history either.  ? ?Otherwise, there is no history of other atopic diseases, including asthma, food allergies, drug allergies, environmental allergies, stinging insect allergies, eczema, urticaria, or contact dermatitis. There is no significant infectious history. Vaccinations are up to date.  ? ? ?Past Medical History: ?Patient Active Problem List  ? Diagnosis Date Noted  ? Arthralgia 03/31/2021  ? Abdominal pain, epigastric 03/31/2021  ? Decreased appetite 03/31/2021  ? Anemia 02/04/2021  ? Weight gain 02/04/2021  ? Ulcerative colitis with complication ([place]) [id number]  ? Immunosuppression due to chronic steroid use ([place]) 12/05/2020  ? Underinsured 12/05/2020  ? Gastroesophageal reflux disease with esophagitis without hemorrhage 12/05/2020  ? Gastroesophageal reflux disease 10/22/2020  ? Tenesmus 10/22/2020  ? Rectal bleeding 10/22/2020  ? Chronic diarrhea 10/22/2020  ? Left sided colitis with rectal  bleeding ([place]) 10/22/2020  ? Allergy to nonsteroidal anti-inflammatory drug (NSAID) 10/22/2020  ? Esophagitis 08/07/2020  ? Duodenitis determined by biopsy 08/07/2020  ? 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 Vitamin D deficiency 08/07/2020  ? Thrombocytosis 08/07/2020  ? Proctitis 08/03/2020  ? Iron deficiency anemia due to chronic blood loss 08/03/2020  ? Hyponatremia 08/03/2020  ? ? ?Medication List:  ?Allergies as of 05/06/2021   ? ?   Reactions  ? Aleve [naproxen Sodium] Anaphylaxis  ? Feraheme [ferumoxytol] Shortness Of Breath  ? Patient had hot flushes and tight ness in ches within 5 minutes of iv infusion  ? Ibuprofen Anaphylaxis, Swelling  ? ?  ? ?  ?Medication List  ?  ? ?  ? Accurate as of May 06, 2021  1:04 PM. If you have any questions, ask your nurse or doctor.  ?  ?  ? ?  ? ?STOP taking these medications   ? ?mercaptopurine 50 MG tablet ?Commonly known as: PURINETHOL ?Stopped by: Valentina Shaggy, MD ?  ? ?  ? ?  TAKE these medications   ? ?acetaminophen 500 MG tablet ?Commonly known as: TYLENOL ?Take 500-1,000 mg by mouth every 8 (eight) hours as needed for mild pain (or headaches). ?  ?ferrous sulfate 325 (65 FE) MG EC tablet ?Take 1 tablet (325 mg total) by mouth daily. ?  ?Humira Pen 40 MG/0.4ML Pnkt ?Generic drug: Adalimumab ?Inject 40 mg into the skin every 14 (fourteen) days. ?  ?pantoprazole 20 MG tablet ?Commonly known as: Protonix ?Take 1 tablet (20 mg total) by mouth daily. ?  ?predniSONE 10 MG tablet ?Commonly known as: DELTASONE ?Take 2 tablets (20 mg total) by mouth every 14 (fourteen) days for 14 days, THEN 1.5 tablets (15 mg total) every 14 (fourteen) days for 14 days, THEN 1 tablet (10 mg total) every 14 (fourteen) days for 14 days, THEN 0.5 tablets (5 mg total) every 14 (fourteen) days for 14 days. ?Start taking on: May 08, 2021 ?  ? ?  ? ? ?Birth History: non-contributory ? ?Developmental History: non-contributory ? ?Past Surgical History: ?Past Surgical History:   ?Procedure Laterality Date  ? BIOPSY  08/04/2020  ? Procedure: BIOPSY;  Surgeon: Irving Copas., MD;  Location: [place];  Service: Gastroenterology;;  ? COLONOSCOPY    ? COLONOSCOPY WITH PROPOFOL N/A

## 2021-05-06 NOTE — Patient Instructions (Addendum)
1. Adverse effect of drug ?- There is no validated testing to most drugs. ?- Therefore the best step is to probably do an NSAID challenge. ?- Make an appointment for that on your way out. ?- I am going to check with Dr. Rush Landmark about the long term plan as well to make sure that we are on the same page. ? ?2. Return in about 4 weeks (around 06/03/2021) for IBUPROFEN CHALLENGE.  ? ? ?Please inform us of any Emergency Department visits, hospitalizations, or changes in symptoms. Call us before going to the ED for breathing or allergy symptoms since we might be able to fit you in for a sick visit. Feel free to contact us anytime with any questions, problems, or concerns. ? ?It was a pleasure to meet you today! ? ?Websites that have reliable patient information: ?1. American Academy of Asthma, Allergy, and Immunology: www.aaaai.org ?2. Food Allergy Research and Education (FARE): foodallergy.org ?3. Mothers of Asthmatics: http://www.asthmacommunitynetwork.org ?4. [employer] of Allergy, Asthma, and Immunology: MonthlyElectricBill.co.uk ? ? ?COVID-19 Vaccine Information can be found at: ShippingScam.co.uk For questions related to vaccine distribution or appointments, please email [email or web address] or call [phone].  ? ?We realize that you might be concerned about having an allergic reaction to the COVID19 vaccines. To help with that concern, WE ARE OFFERING THE COVID19 VACCINES IN OUR OFFICE! Ask the front desk for dates!  ? ? ? ??Like? [place] on Facebook and Instagram for our latest updates!  ?  ? ? ?A healthy democracy works best when [employer] participate! Make sure you are registered to vote! If you have moved or changed any of your contact information, you will need to get this updated before voting! ? ?In some cases, you MAY be able to register to vote online: [email or web address] ? ? ? ? ? ? ?

## 2021-05-07 ENCOUNTER — Other Ambulatory Visit (INDEPENDENT_AMBULATORY_CARE_PROVIDER_SITE_OTHER): Payer: BC Managed Care – PPO

## 2021-05-07 DIAGNOSIS — D500 Iron deficiency anemia secondary to blood loss (chronic): Secondary | ICD-10-CM

## 2021-05-07 DIAGNOSIS — K51919 Ulcerative colitis, unspecified with unspecified complications: Secondary | ICD-10-CM

## 2021-05-07 LAB — BASIC METABOLIC PANEL
BUN: 9 mg/dL (ref 6–23)
CO2: 30 mEq/L (ref 19–32)
Calcium: 9.6 mg/dL (ref 8.4–10.5)
Chloride: 104 mEq/L (ref 96–112)
Creatinine, Ser: 0.8 mg/dL (ref 0.40–1.50)
GFR: 124.27 mL/min (ref >60.00)
Glucose, Bld: 117 mg/dL — ABNORMAL HIGH (ref 70–99)
Potassium: 3.8 mEq/L (ref 3.5–5.1)
Sodium: 140 mEq/L (ref 135–145)

## 2021-05-07 LAB — COMPREHENSIVE METABOLIC PANEL
ALT: 8 U/L (ref 0–53)
AST: 6 U/L (ref 0–37)
Albumin: 3.9 g/dL (ref 3.5–5.2)
Alkaline Phosphatase: 54 U/L (ref 39–117)
BUN: 9 mg/dL (ref 6–23)
CO2: 30 mEq/L (ref 19–32)
Calcium: 9.6 mg/dL (ref 8.4–10.5)
Chloride: 104 mEq/L (ref 96–112)
Creatinine, Ser: 0.8 mg/dL (ref 0.40–1.50)
GFR: 124.27 mL/min (ref >60.00)
Glucose, Bld: 117 mg/dL — ABNORMAL HIGH (ref 70–99)
Potassium: 3.8 mEq/L (ref 3.5–5.1)
Sodium: 140 mEq/L (ref 135–145)
Total Bilirubin: 0.3 mg/dL (ref 0.2–1.2)
Total Protein: 6.8 g/dL (ref 6.0–8.3)

## 2021-05-07 LAB — CBC
HCT: 34.7 % — ABNORMAL LOW (ref 39.0–52.0)
Hemoglobin: 10.8 g/dL — ABNORMAL LOW (ref 13.0–17.0)
MCHC: 31 g/dL (ref 30.0–36.0)
MCV: 76.8 fl — ABNORMAL LOW (ref 78.0–100.0)
Platelets: 544 10*3/uL — ABNORMAL HIGH (ref 150.0–400.0)
RBC: 4.52 Mil/uL (ref 4.22–5.81)
RDW: 21.6 % — ABNORMAL HIGH (ref 11.5–15.5)
WBC: 14.1 10*3/uL — ABNORMAL HIGH (ref 4.0–10.5)

## 2021-05-07 LAB — C-REACTIVE PROTEIN: CRP: <1 mg/dL (ref 0.5–20.0)

## 2021-05-07 LAB — SEDIMENTATION RATE: Sed Rate: 21 mm/hr — ABNORMAL HIGH (ref 0–15)

## 2021-05-13 ENCOUNTER — Other Ambulatory Visit: Payer: Self-pay

## 2021-05-13 DIAGNOSIS — K51919 Ulcerative colitis, unspecified with unspecified complications: Secondary | ICD-10-CM

## 2021-05-13 LAB — THIOPURINE METABOLITES

## 2021-05-14 ENCOUNTER — Other Ambulatory Visit: Payer: BC Managed Care – PPO

## 2021-05-17 LAB — SERIAL MONITORING

## 2021-05-17 LAB — ADALIMUMAB+AB (SERIAL MONITOR)
Adalimumab Drug Level: 2.7 ug/mL
Anti-Adalimumab Antibody: <25 ng/mL

## 2021-05-18 ENCOUNTER — Telehealth: Payer: Self-pay

## 2021-05-18 ENCOUNTER — Other Ambulatory Visit: Payer: Self-pay

## 2021-05-18 MED ORDER — HUMIRA (2 PEN) 40 MG/0.4ML ~~LOC~~ AJKT: 40.0000 mg | AUTO-INJECTOR | SUBCUTANEOUS | 6 refills | Status: DC

## 2021-05-18 NOTE — Telephone Encounter (Signed)
Isaiah Cleverly, Isaiah Carpenter  Isaiah Carpenter, Isaiah Starring., Isaiah Carpenter; Isaiah Lank Willaim Rayas, Isaiah Carpenter; Isaiah Stapler, Isaiah Carpenter ?I agree. I am surprised to see such a low trough right after loading phase and think he would be a good candidate for quickly escalating to weekly therapy, with a low threshold to call him a non-responder and change therapy. Agree that if he isn't responding, would go with infliximab next for severe disease. He did have low albumin at dx, but that's improved now, so maybe even better chance for the biologic to work.  ? ?Agree with dual therapy approach given nasty disease on index colo and young age. I see 6-TG again ordered, but curious if that too is not therapeutic with the MCV:WBC ratio of ~6. Maybe just thrown off by his persistent leukocytosis. Will see what 6-TG shows.  ? ?Really tough case, but agree with weekly Humira now and if no response, infliximab. Keep [place] posted on how he does!  ? ?VC  ?

## 2021-05-18 NOTE — Telephone Encounter (Signed)
-----   Message from [name]., MD sent at 05/18/2021  8:59 AM EDT ----- ?SA, ?Thanks for the insights. ?I will begin working on trying to see if the insurance would give [place] approval for that and I will be seeing him back in clinic in the next couple of weeks to determine things. ?Appreciate it. ?GM ? ?Karleigh Bunte, ?Can you go ahead and start moving forward with seeing what you need to do in regards to possible increase in Humira to weekly dosing from the insurance perspective?  We will hold on that until I see him in clinic and discuss things further but it would be for subtherapeutic dosing of Humira based on current labs. ?Thanks. ?GM ?----- Message ----- ?From: Benancio Deeds, MD ?Sent: 05/17/2021   5:58 PM EDT ?To: [name]., MD, # ? ?Augustin Coupe, ?Interesting case.  Sounds like he is also on 6-MP at therapeutic dosing (1mg /kg) as well. ?Difficult situation.  Looks like he has pretty severe colitis, anemic etc.  If his drug level is already subtherapeutic after loading this early, with his severe disease I would get aggressive early in his course and see if insurance would cover him dosed every week with the Humira.  If he fails early Humira you don't want it to be from the subtherapeutic level that you know he has right now.  I know insurance can play a large role in terms of what drug we are able to give and how it is dosed, but I think if this were my patient I would try to get him a 40mg  / weekly early on to get his levels better and give him the best chance to respond. Otherwise, is this his first biologic?  For severe colitis I have not been too impressed with Humira, hopefully he is not a primary nonresponder. Has he tried Remicade or another class yet? , curious to get your take. ? ? ? ?----- Message ----- ?From: Mansouraty, Gae Bon., MD ?Sent: 05/17/2021   4:23 PM EDT ?To: Netty Starring, MD, 05/19/2021, DO ? ?SA and VC, ?Hope you are both well. ?Wanted to get  your take as my IBD experts with next steps/approach. ?This patient has likely UC and I could not get him off steroids until he had insurance.  Finally able to get him insurance and Humira was initiated.  Have finished his induction and subsequent first dose of therapy. ?I think he still has active disease. ?Thus I got labs to make sure he did not develop any antibodies and see what his levels were. ?No antibodies as expected, but quite a low level. ?My thought/hope was to keep him for at least 2 more injections (every 2 weeks) and then recheck his drug level. ?If his drug level remains this low, should I transition him to weekly Humira? ?Thanks for thoughts. ?GM ?----- Message ----- ?From: Interface, [facility] ?Sent: 05/07/2021   3:18 PM EDT ?To: Shellia Cleverly., MD ? ? ? ? ?

## 2021-05-18 NOTE — Telephone Encounter (Signed)
See alternate results note.  

## 2021-05-24 ENCOUNTER — Other Ambulatory Visit: Payer: Self-pay

## 2021-05-24 MED ORDER — HUMIRA (2 PEN) 40 MG/0.4ML ~~LOC~~ AJKT: 40.0000 mg | AUTO-INJECTOR | SUBCUTANEOUS | 6 refills | Status: DC

## 2021-05-27 NOTE — Progress Notes (Signed)
Please see the following.   Thanks  

## 2021-05-27 NOTE — Progress Notes (Signed)
Called & left message per DPR to schedule ibuprofen challenge.  ?

## 2021-06-02 ENCOUNTER — Other Ambulatory Visit: Payer: Self-pay | Admitting: Gastroenterology

## 2021-06-03 ENCOUNTER — Ambulatory Visit (INDEPENDENT_AMBULATORY_CARE_PROVIDER_SITE_OTHER): Payer: BC Managed Care – PPO | Admitting: Gastroenterology

## 2021-06-03 ENCOUNTER — Other Ambulatory Visit (INDEPENDENT_AMBULATORY_CARE_PROVIDER_SITE_OTHER): Payer: BC Managed Care – PPO

## 2021-06-03 VITALS — BP 104/62 | HR 74 | Ht 67.0 in | Wt 152.0 lb

## 2021-06-03 DIAGNOSIS — D500 Iron deficiency anemia secondary to blood loss (chronic): Secondary | ICD-10-CM | POA: Diagnosis not present

## 2021-06-03 DIAGNOSIS — Z9225 Personal history of immunosupression therapy: Secondary | ICD-10-CM

## 2021-06-03 DIAGNOSIS — K51919 Ulcerative colitis, unspecified with unspecified complications: Secondary | ICD-10-CM

## 2021-06-03 DIAGNOSIS — K2100 Gastro-esophageal reflux disease with esophagitis, without bleeding: Secondary | ICD-10-CM | POA: Diagnosis not present

## 2021-06-03 DIAGNOSIS — K5100 Ulcerative (chronic) pancolitis without complications: Secondary | ICD-10-CM

## 2021-06-03 LAB — CBC
HCT: 38.4 % — ABNORMAL LOW (ref 39.0–52.0)
Hemoglobin: 12.1 g/dL — ABNORMAL LOW (ref 13.0–17.0)
MCHC: 31.6 g/dL (ref 30.0–36.0)
MCV: 83.5 fl (ref 78.0–100.0)
Platelets: 396 10*3/uL (ref 150.0–400.0)
RBC: 4.6 Mil/uL (ref 4.22–5.81)
RDW: 22.3 % — ABNORMAL HIGH (ref 11.5–15.5)
WBC: 11.2 10*3/uL — ABNORMAL HIGH (ref 4.0–10.5)

## 2021-06-03 LAB — COMPREHENSIVE METABOLIC PANEL
ALT: 8 U/L (ref 0–53)
AST: 9 U/L (ref 0–37)
Albumin: 4.3 g/dL (ref 3.5–5.2)
Alkaline Phosphatase: 43 U/L (ref 39–117)
BUN: 7 mg/dL (ref 6–23)
CO2: 26 mEq/L (ref 19–32)
Calcium: 9.8 mg/dL (ref 8.4–10.5)
Chloride: 105 mEq/L (ref 96–112)
Creatinine, Ser: 0.9 mg/dL (ref 0.40–1.50)
GFR: 119.87 mL/min (ref >60.00)
Glucose, Bld: 94 mg/dL (ref 70–99)
Potassium: 4.3 mEq/L (ref 3.5–5.1)
Sodium: 137 mEq/L (ref 135–145)
Total Bilirubin: 0.5 mg/dL (ref 0.2–1.2)
Total Protein: 6.8 g/dL (ref 6.0–8.3)

## 2021-06-03 LAB — BASIC METABOLIC PANEL
BUN: 7 mg/dL (ref 6–23)
CO2: 26 mEq/L (ref 19–32)
Calcium: 9.8 mg/dL (ref 8.4–10.5)
Chloride: 105 mEq/L (ref 96–112)
Creatinine, Ser: 0.9 mg/dL (ref 0.40–1.50)
GFR: 119.87 mL/min (ref >60.00)
Glucose, Bld: 94 mg/dL (ref 70–99)
Potassium: 4.3 mEq/L (ref 3.5–5.1)
Sodium: 137 mEq/L (ref 135–145)

## 2021-06-03 LAB — SEDIMENTATION RATE: Sed Rate: 7 mm/hr (ref 0–15)

## 2021-06-03 LAB — C-REACTIVE PROTEIN: CRP: <1 mg/dL (ref 0.5–20.0)

## 2021-06-03 NOTE — Progress Notes (Signed)
? ?GASTROENTEROLOGY OUTPATIENT CLINIC VISIT  ? ?Primary Care Provider ?Mansouraty, Telford Nab., MD ?[place] ?[place] [address] ?[phone] ? ? ?Patient Profile: ?Isaiah Carpenter is a 26 y.o. male with a pmh significant for at least left-sided colitis (likely UC on steroids and 6MP and now Humira), GERD (with esophagitis), NSAID anaphylaxis, IDA.  The patient presents to the [facility] for an evaluation and management of problem(s) noted below: ? ?Problem List ?1. 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 Iron deficiency anemia due to chronic blood loss   ?3. Gastroesophageal reflux disease with esophagitis without hemorrhage   ?4. Personal history of immunosupression therapy   ? ? ? ?History of Present Illness ?Please see prior notes for full details of HPI. ? ?Interval History ?The patient is seen in follow-up today.  He has been doing well on his steroid taper.  He has not had a "flare" in weeks.  He is not having any rectal bleeding.  He is Humira levels were quite low.  His 6-MP metabolites were not able to be processed so he needs to have those redone.  I discussed his case with a few of my colleagues in the consideration of Rubin Payor weekly was brought up.  Patient feels like he is able to have more energy than he did previously.  He will start 5 mg of prednisone tomorrow. ? ?BMs per day -2-3 ?Nocturnal BMs -none ?Blood -none ?Mucous -none ?Tenesmus -none ?Urgency -much less than before ?Skin Manifestations -denies ?Eye Manifestations -denies ?Joint Manifestations -denies ? ?GI Review of Systems ?Positive as above  ?Negative for dysphagia, odynophagia, nausea, vomiting, pain, melena, hematochezia  ? ?Review of Systems ?General: Positive for unintentional weight gain due to steroids; denies fevers/chills ?HEENT: Denies oral lesions ?Cardiovascular: Denies chest pain ?Pulmonary: Denies shortness of breath ?Gastroenterological: See HPI ?Genitourinary: Denies darkened  urine ?Hematological: Denies easy bruising/bleeding ?Dermatological: Denies jaundice or skin nodules or new rashes ?Psychological: Mood is stable ?Allergy & Immunology: He has seen allergy and will be undergoing desensitization potentially in the near future ?Musculoskeletal: Denies progressive arthralgias ? ? ?Medications ?Current Outpatient Medications  ?Medication Sig Dispense Refill  ? acetaminophen (TYLENOL) 500 MG tablet Take 500-1,000 mg by mouth every 8 (eight) hours as needed for mild pain (or headaches).    ? pantoprazole (PROTONIX) 20 MG tablet Take 1 tablet (20 mg total) by mouth daily. 30 tablet 2  ? predniSONE (DELTASONE) 10 MG tablet Take 2 tablets (20 mg total) by mouth every 14 (fourteen) days for 14 days, THEN 1.5 tablets (15 mg total) every 14 (fourteen) days for 14 days, THEN 1 tablet (10 mg total) every 14 (fourteen) days for 14 days, THEN 0.5 tablets (5 mg total) every 14 (fourteen) days for 14 days. 60 tablet 0  ? Adalimumab (HUMIRA PEN) 40 MG/0.4ML PNKT Inject 40 mg into the skin every 7 (seven) days for 6 days. 4 each 6  ? ferrous sulfate 325 (65 FE) MG EC tablet Take 1 tablet (325 mg total) by mouth daily. 60 tablet 0  ? ?No current facility-administered medications for this visit.  ? ? ?Allergies ?Allergies  ?Allergen Reactions  ? Aleve [Naproxen Sodium] Anaphylaxis  ? Feraheme [Ferumoxytol] Shortness Of Breath  ?  Patient had hot flushes and tight ness in ches within 5 minutes of iv infusion  ? Ibuprofen Anaphylaxis and Swelling  ? ? ?Histories ?Past Medical History:  ?Diagnosis Date  ? Anemia   ? ?Past Surgical History:  ?Procedure Laterality Date  ?  BIOPSY  08/04/2020  ? Procedure: BIOPSY;  Surgeon: Irving Copas., MD;  Location: [place];  Service: Gastroenterology;;  ? COLONOSCOPY    ? COLONOSCOPY WITH PROPOFOL N/A 08/04/2020  ? Procedure: COLONOSCOPY;  Surgeon: Rush Landmark Telford Nab., MD;  Location: [place];  Service: Gastroenterology;  Laterality: N/A;  ?  ESOPHAGOGASTRODUODENOSCOPY N/A 08/04/2020  ? Procedure: ESOPHAGOGASTRODUODENOSCOPY (EGD);  Surgeon: Irving Copas., MD;  Location: [place];  Service: Gastroenterology;  Laterality: N/A;  ? UPPER GASTROINTESTINAL ENDOSCOPY    ? ?Social History  ? ?Socioeconomic History  ? Marital status: Single  ?  Spouse name: Not on file  ? Number of children: Not on file  ? Years of education: Not on file  ? Highest education level: Not on file  ?Occupational History  ? Not on file  ?Tobacco Use  ? Smoking status: Former  ?  Packs/day: 0.50  ?  Types: Cigarettes  ? Smokeless tobacco: Never  ?Vaping Use  ? Vaping Use: Some days  ?Substance and Sexual Activity  ? Alcohol use: Yes  ?  Comment: rarely  ? Drug use: Never  ? Sexual activity: Yes  ?Other Topics Concern  ? Not on file  ?Social History Narrative  ? Not on file  ? ?Social Determinants of Health  ? ?Financial Resource Strain: Not on file  ?Food Insecurity: Not on file  ?Transportation Needs: Not on file  ?Physical Activity: Not on file  ?Stress: Not on file  ?Social Connections: Not on file  ?Intimate Partner Violence: Not on file  ? ?Family History  ?Problem Relation Age of Onset  ? Colon cancer Neg Hx   ? Esophageal cancer Neg Hx   ? Stomach cancer Neg Hx   ? Inflammatory bowel disease Neg Hx   ? Liver disease Neg Hx   ? Pancreatic cancer Neg Hx   ? Rectal cancer Neg Hx   ? ?I have reviewed his medical, social, and family history in detail and updated the electronic medical record as necessary.  ? ? ?PHYSICAL EXAMINATION  ?BP 104/62   Pulse 74   Ht 5\' 7"  (1.702 m)   Wt 152 lb (68.9 kg)   BMI 23.81 kg/m?  ?Wt Readings from Last 3 Encounters:  ?06/03/21 152 lb (68.9 kg)  ?05/06/21 155 lb 4 oz (70.4 kg)  ?04/27/21 153 lb 6 oz (69.6 kg)  ?GEN: NAD, appears stated age, doesn't appear chronically ill, this is the best I have seen the patient in the last few months ?PSYCH: Cooperative, without pressured speech ?EYE: Conjunctivae pink, sclerae anicteric ?ENT:  MMM, no oral ulcers or lesions ?CV: Nontachycardic ?RESP: No audible wheezing ?GI: NABS, soft, rounded abdomen, NT, nondistended, without rebound or guarding ?MSK/EXT: No lower extremity edema ?SKIN: No jaundice, no concerning rashes ?NEURO:  Alert & Oriented x 3, no focal deficits ? ? ?REVIEW OF DATA  ?I reviewed the following data at the time of this encounter: ? ?GI Procedures and Studies  ?Previously reviewed ? ?Laboratory Studies  ?Reviewed those in epic ? ?Imaging Studies  ?No new studies to review ? ? ?ASSESSMENT  ?Mr. Tristan is a 26 y.o. male with a pmh significant for at least left-sided colitis (likely UC on steroids and 6MP and now Humira), GERD (with esophagitis), NSAID anaphylaxis, IDA.  The patient is seen today for evaluation and management of: ? ?1. 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 Iron deficiency anemia due to chronic blood loss   ?3. Gastroesophageal reflux disease with esophagitis without  hemorrhage   ?4. Personal history of immunosupression therapy   ? ?The patient is hemodynamically and clinically stable.  He seems to be doing much better as we have continued with his Humira every 2 weekly dosing.  He seems to be tolerating his steroid taper.  He still has 2 weeks of tapering at 5 mg.  Hopefully he will do well with this.  His Humira drug levels were quite low.  For his extensive amount of disease as he had on his initial colonoscopic evaluation, we were concerned about his ability to have enough therapy GIST on every 2 weekly basis.  We were considering going up to weekly basis if his drug monitoring showed persistent low levels.  Based on how he is doing I do think it is worthwhile repeating those now that he has been another month of therapy.  If his drug levels remain low and he is doing very well then we would potentially maintain 2-week dosing of Humira until colonoscopy evaluation.  If his drug level is low and he is not doing well then we will consider  increasing/titrating up to weekly Humira and giving that a bit of time before a colonoscopy is performed.  Time will tell.  He has not gotten any Humira and he is going to call the company to see about getting that as quickly as possible.  I

## 2021-06-03 NOTE — Patient Instructions (Addendum)
Your provider has requested that you go to the basement level for lab work before leaving today. Press "B" on the elevator. The lab is located at the first door on the left as you exit the elevator. ? ?Continue Prednisone taper as directed.  ? ?Follow up on: 07/20/21 at 3:10pm  ? ?Thank you for choosing me and [facility]. ? ?Dr. Rush Landmark ? ?

## 2021-06-04 ENCOUNTER — Encounter: Payer: Self-pay | Admitting: Gastroenterology

## 2021-06-04 DIAGNOSIS — Z9225 Personal history of immunosupression therapy: Secondary | ICD-10-CM | POA: Insufficient documentation

## 2021-06-08 LAB — THIOPURINE METABOLITES
6 MMP(6-Methylmercaptopurine): <500 pmol/8x10(8)RBC (ref <5700)
6 TG(6-Thioguanine): <50 pmol/8x10(8)RBC — ABNORMAL LOW (ref 235–400)

## 2021-06-08 LAB — SERIAL MONITORING

## 2021-06-09 LAB — ADALIMUMAB+AB (SERIAL MONITOR)
Adalimumab Drug Level: 5.7 ug/mL
Anti-Adalimumab Antibody: <25 ng/mL

## 2021-06-11 ENCOUNTER — Encounter: Payer: Self-pay | Admitting: Gastroenterology

## 2021-07-01 ENCOUNTER — Telehealth: Payer: Self-pay

## 2021-07-01 NOTE — Telephone Encounter (Signed)
Approval for Humira received from [employer] form 05/24/21-06/30/22 ?No authorization number is available. ?

## 2021-07-20 ENCOUNTER — Ambulatory Visit: Payer: BC Managed Care – PPO | Admitting: Gastroenterology

## 2021-08-27 ENCOUNTER — Encounter: Payer: Self-pay | Admitting: Gastroenterology

## 2021-10-11 ENCOUNTER — Encounter: Payer: Self-pay | Admitting: Gastroenterology

## 2021-10-11 NOTE — Telephone Encounter (Signed)
Received a My Chart from the pt stating his med can not be filled.  I did call and received a Case number # [id number] approved 09/15/21-8/20/224  The pt has been advised that I have spoken to the insurance over the phone and he should be able to get his prescription now.

## 2021-10-19 ENCOUNTER — Other Ambulatory Visit (INDEPENDENT_AMBULATORY_CARE_PROVIDER_SITE_OTHER): Payer: BC Managed Care – PPO

## 2021-10-19 ENCOUNTER — Encounter: Payer: Self-pay | Admitting: Gastroenterology

## 2021-10-19 ENCOUNTER — Ambulatory Visit (INDEPENDENT_AMBULATORY_CARE_PROVIDER_SITE_OTHER): Payer: BC Managed Care – PPO | Admitting: Gastroenterology

## 2021-10-19 VITALS — BP 112/72 | HR 67 | Ht 67.0 in | Wt 149.0 lb

## 2021-10-19 DIAGNOSIS — K649 Unspecified hemorrhoids: Secondary | ICD-10-CM | POA: Diagnosis not present

## 2021-10-19 DIAGNOSIS — K5100 Ulcerative (chronic) pancolitis without complications: Secondary | ICD-10-CM

## 2021-10-19 DIAGNOSIS — Z8719 Personal history of other diseases of the digestive system: Secondary | ICD-10-CM

## 2021-10-19 DIAGNOSIS — Z9225 Personal history of immunosupression therapy: Secondary | ICD-10-CM

## 2021-10-19 DIAGNOSIS — D500 Iron deficiency anemia secondary to blood loss (chronic): Secondary | ICD-10-CM | POA: Diagnosis not present

## 2021-10-19 LAB — COMPREHENSIVE METABOLIC PANEL
ALT: 8 U/L (ref 0–53)
AST: 12 U/L (ref 0–37)
Albumin: 4.5 g/dL (ref 3.5–5.2)
Alkaline Phosphatase: 82 U/L (ref 39–117)
BUN: 6 mg/dL (ref 6–23)
CO2: 27 mEq/L (ref 19–32)
Calcium: 10.4 mg/dL (ref 8.4–10.5)
Chloride: 104 mEq/L (ref 96–112)
Creatinine, Ser: 0.95 mg/dL (ref 0.40–1.50)
GFR: 112.04 mL/min (ref >60.00)
Glucose, Bld: 85 mg/dL (ref 70–99)
Potassium: 4 mEq/L (ref 3.5–5.1)
Sodium: 140 mEq/L (ref 135–145)
Total Bilirubin: 1.1 mg/dL (ref 0.2–1.2)
Total Protein: 7.6 g/dL (ref 6.0–8.3)

## 2021-10-19 LAB — IBC + FERRITIN
Ferritin: 4.4 ng/mL — ABNORMAL LOW (ref 22.0–322.0)
Iron: 48 ug/dL (ref 42–165)
Saturation Ratios: 9 % — ABNORMAL LOW (ref 20.0–50.0)
TIBC: 532 ug/dL — ABNORMAL HIGH (ref 250.0–450.0)
Transferrin: 380 mg/dL — ABNORMAL HIGH (ref 212.0–360.0)

## 2021-10-19 LAB — CBC WITH DIFFERENTIAL/PLATELET
Basophils Absolute: 0 10*3/uL (ref 0.0–0.1)
Basophils Relative: 0.8 % (ref 0.0–3.0)
Eosinophils Absolute: 0.1 10*3/uL (ref 0.0–0.7)
Eosinophils Relative: 1.1 % (ref 0.0–5.0)
HCT: 41.2 % (ref 39.0–52.0)
Hemoglobin: 13.7 g/dL (ref 13.0–17.0)
Lymphocytes Relative: 32.2 % (ref 12.0–46.0)
Lymphs Abs: 1.5 10*3/uL (ref 0.7–4.0)
MCHC: 33.2 g/dL (ref 30.0–36.0)
MCV: 87.1 fl (ref 78.0–100.0)
Monocytes Absolute: 0.4 10*3/uL (ref 0.1–1.0)
Monocytes Relative: 8.6 % (ref 3.0–12.0)
Neutro Abs: 2.7 10*3/uL (ref 1.4–7.7)
Neutrophils Relative %: 57.3 % (ref 43.0–77.0)
Platelets: 270 10*3/uL (ref 150.0–400.0)
RBC: 4.73 Mil/uL (ref 4.22–5.81)
RDW: 14.8 % (ref 11.5–15.5)
WBC: 4.8 10*3/uL (ref 4.0–10.5)

## 2021-10-19 LAB — SEDIMENTATION RATE: Sed Rate: 2 mm/hr (ref 0–15)

## 2021-10-19 LAB — HIGH SENSITIVITY CRP: CRP, High Sensitivity: <0.2 mg/L (ref 0.000–5.000)

## 2021-10-19 MED ORDER — NA SULFATE-K SULFATE-MG SULF 17.5-3.13-1.6 GM/177ML PO SOLN: 1.0000 | Freq: Once | ORAL | 0 refills | Status: AC

## 2021-10-19 NOTE — Patient Instructions (Addendum)
If you are age 24 or older, your body mass index should be between 23-30. Your Body mass index is 23.34 kg/m. If this is out of the aforementioned range listed, please consider follow up with your Primary Care Provider.  If you are age 2 or younger, your body mass index should be between 19-25. Your Body mass index is 23.34 kg/m. If this is out of the aformentioned range listed, please consider follow up with your Primary Care Provider.   ________________________________________________________  The [place] [facility] providers would like to encourage you to use [facility] to communicate with providers for non-urgent requests or questions.  Due to long hold times on the telephone, sending your provider a message by [facility] may be a faster and more efficient way to get a response.  Please allow 48 business hours for a response.  Please remember that this is for non-urgent requests.  _______________________________________________________   Dennis Bast have been scheduled for an endoscopy and colonoscopy. Please follow the written instructions given to you at your visit today. Please pick up your prep supplies at the pharmacy within the next 1-3 days. If you use inhalers (even only as needed), please bring them with you on the day of your procedure.   Your provider has requested that you go to the basement level for lab work before leaving today. Press "B" on the elevator. The lab is located at the first door on the left as you exit the elevator.   Due to recent changes in healthcare laws, you may see the results of your imaging and laboratory studies on MyChart before your provider has had a chance to review them.  We understand that in some cases there may be results that are confusing or concerning to you. Not all laboratory results come back in the same time frame and the provider may be waiting for multiple results in order to interpret others.  Please give [place] 48 hours in order for your provider to thoroughly  review all the results before contacting the office for clarification of your results.    It was a pleasure to see you today!  Thank you for trusting me with your gastrointestinal care!

## 2021-10-20 ENCOUNTER — Other Ambulatory Visit: Payer: Self-pay

## 2021-10-20 DIAGNOSIS — D500 Iron deficiency anemia secondary to blood loss (chronic): Secondary | ICD-10-CM

## 2021-10-20 MED ORDER — FERROUS SULFATE 325 (65 FE) MG PO TBEC: 325.0000 mg | DELAYED_RELEASE_TABLET | Freq: Two times a day (BID) | ORAL | 6 refills | Status: DC

## 2021-10-22 ENCOUNTER — Encounter: Payer: Self-pay | Admitting: Gastroenterology

## 2021-10-22 DIAGNOSIS — Z8719 Personal history of other diseases of the digestive system: Secondary | ICD-10-CM | POA: Insufficient documentation

## 2021-10-22 DIAGNOSIS — K649 Unspecified hemorrhoids: Secondary | ICD-10-CM | POA: Insufficient documentation

## 2021-10-22 DIAGNOSIS — K5100 Ulcerative (chronic) pancolitis without complications: Secondary | ICD-10-CM | POA: Insufficient documentation

## 2021-10-22 NOTE — Progress Notes (Signed)
GASTROENTEROLOGY OUTPATIENT CLINIC VISIT   Primary Care Provider No primary care provider on file. No primary provider on file. None   Patient Profile: Isaiah Carpenter is a 26 y.o. male with a pmh significant for at least left-sided colitis (likely UC previously on steroids and 6MP and now just Humira), GERD (with esophagitis), NSAID anaphylaxis, IDA (IV iron anaphylaxis).  The patient presents to the [facility] Gastroenterology Clinic for an evaluation and management of problem(s) noted below:  Problem List 1. 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 Hemorrhoids, unspecified hemorrhoid type   3. Hx of esophagitis   4. Iron deficiency anemia due to chronic blood loss   5. Personal history of immunosupression therapy     History of Present Illness Please see prior notes for full details of HPI.  Interval History The patient is seen for follow-up today.  He feels significantly better now that he has been on Humira for a few months.  He has not had a flare in weeks.  He has had some rectal discomfort and irritation from his presumed hemorrhoids.  He is wondering as to whether there may be some therapies for this.  Otherwise he feels like he is back to his normal self.  Much of the weight that he had gained while on prednisone as gone away.  IBD diary BMs per day -between 1 and 2  Nocturnal BMs -none Blood -none Mucous -none Tenesmus -none Urgency -none Skin Manifestations -denies Eye Manifestations -denies Joint Manifestations -denies  GI Review of Systems Positive as above  Negative for odynophagia, dysphagia, nausea, vomiting, abdominal pain   Review of Systems General: Denies fevers/chills/unintentional weight loss HEENT: Denies oral lesions Cardiovascular: Denies chest pain Pulmonary: Denies shortness of breath Gastroenterological: See HPI Genitourinary: Denies darkened urine Hematological: Denies easy bruising/bleeding Dermatological: Denies jaundice or skin  nodules or new rashes Psychological: Mood is stable Allergy & Immunology: No new allergies Musculoskeletal: Denies progressive arthralgias   Medications Current Outpatient Medications  Medication Sig Dispense Refill   acetaminophen (TYLENOL) 500 MG tablet Take 500-1,000 mg by mouth every 8 (eight) hours as needed for mild pain (or headaches).     Adalimumab (HUMIRA PEN) 40 MG/0.4ML PNKT Inject 40 mg into the skin every 7 (seven) days for 6 days. 4 each 6   pantoprazole (PROTONIX) 20 MG tablet Take 1 tablet (20 mg total) by mouth daily. 30 tablet 2   ferrous sulfate 325 (65 FE) MG EC tablet Take 1 tablet (325 mg total) by mouth 2 (two) times daily. 60 tablet 6   No current facility-administered medications for this visit.    Allergies Allergies  Allergen Reactions   Aleve [Naproxen Sodium] Anaphylaxis   Feraheme [Ferumoxytol] Shortness Of Breath    Patient had hot flushes and tight ness in ches within 5 minutes of iv infusion   Ibuprofen Anaphylaxis and Swelling    Histories Past Medical History:  Diagnosis Date   Anemia    Past Surgical History:  Procedure Laterality Date   BIOPSY  08/04/2020   Procedure: BIOPSY;  Surgeon: Irving Copas., MD;  Location: [place];  Service: Gastroenterology;;   COLONOSCOPY     COLONOSCOPY WITH PROPOFOL N/A 08/04/2020   Procedure: COLONOSCOPY;  Surgeon: Irving Copas., MD;  Location: [facility] ENDOSCOPY;  Service: Gastroenterology;  Laterality: N/A;   ESOPHAGOGASTRODUODENOSCOPY N/A 08/04/2020   Procedure: ESOPHAGOGASTRODUODENOSCOPY (EGD);  Surgeon: Irving Copas., MD;  Location: [place];  Service: Gastroenterology;  Laterality: N/A;   UPPER GASTROINTESTINAL ENDOSCOPY  Social History   Socioeconomic History   Marital status: Single    Spouse name: Not on file   Number of children: Not on file   Years of education: Not on file   Highest education level: Not on file  Occupational History   Not on file   Tobacco Use   Smoking status: Former    Packs/day: 0.50    Types: Cigarettes   Smokeless tobacco: Never  Vaping Use   Vaping Use: Some days  Substance and Sexual Activity   Alcohol use: Yes    Comment: rarely   Drug use: Never   Sexual activity: Yes  Other Topics Concern   Not on file  Social History Narrative   Not on file   Social Determinants of Health   Financial Resource Strain: Not on file  Food Insecurity: Not on file  Transportation Needs: Not on file  Physical Activity: Not on file  Stress: Not on file  Social Connections: Not on file  Intimate Partner Violence: Not on file   Family History  Problem Relation Age of Onset   Colon cancer Neg Hx    Esophageal cancer Neg Hx    Stomach cancer Neg Hx    Inflammatory bowel disease Neg Hx    Liver disease Neg Hx    Pancreatic cancer Neg Hx    Rectal cancer Neg Hx    I have reviewed his medical, social, and family history in detail and updated the electronic medical record as necessary.    PHYSICAL EXAMINATION  BP 112/72   Pulse 67   Ht 5' 7"  (1.702 m)   Wt 149 lb (67.6 kg)   SpO2 97%   BMI 23.34 kg/m  Wt Readings from Last 3 Encounters:  10/19/21 149 lb (67.6 kg)  06/03/21 152 lb (68.9 kg)  05/06/21 155 lb 4 oz (70.4 kg)  GEN: NAD, appears stated age, doesn't appear chronically ill, he looks healthier than he did previously PSYCH: Cooperative, without pressured speech EYE: Conjunctivae pink, sclerae anicteric ENT: MMM, no oral ulcers or lesions CV: Nontachycardic RESP: No audible wheezing GI: NABS, soft, rounded abdomen, NT, nondistended, without rebound or guarding MSK/EXT: No lower extremity edema SKIN: No jaundice, no concerning rashes NEURO:  Alert & Oriented x 3, no focal deficits   REVIEW OF DATA  I reviewed the following data at the time of this encounter:  GI Procedures and Studies  Previously reviewed  Laboratory Studies  Reviewed those in epic  Imaging Studies  No new studies to  review   ASSESSMENT  Isaiah Carpenter is a 26 y.o. male with a pmh significant for at least left-sided colitis (likely UC previously on steroids and 6MP and now just Humira), GERD (with esophagitis), NSAID anaphylaxis, IDA (IV iron anaphylaxis).  The patient is seen today for evaluation and management of:  1. Chronic pancolonic ulcerative colitis ([place])   2. Hemorrhoids, unspecified hemorrhoid type   3. Hx of esophagitis   4. Iron deficiency anemia due to chronic blood loss   5. Personal history of immunosupression therapy    The patient is clinically and hemodynamically stable at this time.  It seems that he has done well on his current Humira dosing.  I think he is in a good position currently.  We will plan to see how his colon is doing with a colonoscopy in the coming weeks/months.  At the same time we will do an updated EGD due to his history of significant esophagitis though he has  done well from a heartburn perspective currently.  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  We may need to consider other topical or suppository therapies if he has persisting proctitis just to ensure that there is nothing else is going on in case he were to need hemorrhoidal therapies.  Since he is doing so well he is not really keen to making too many other medication adjustments.  We will need to think about readdition of 6-MP or azathioprine to try and decrease the risk of antibody formation in the future so that Humira can continue to work for him.  We will get some updated labs today and see how his iron levels look as well.  We will see him for his upcoming EGD/colonoscopy based on his availability.  The risks and benefits of endoscopic evaluation were discussed with the patient; these include but are not limited to the risk of perforation, infection, bleeding, missed lesions, lack of diagnosis, severe illness requiring hospitalization, as well as anesthesia and  sedation related illnesses.  The patient and/or family is agreeable to proceed.  All patient questions were answered to the best of my ability, and the patient agrees to the aforementioned plan of action with follow-up as indicated.   PLAN  Continue Humira every 2 weeks - We will consider TDM based on what we find at time of colonoscopy - We will consider 6-MP/azathioprine restart to try to delay/prevent antibody formation EGD/colonoscopy to be performed this year Laboratories as outlined below Continue oral iron daily and will make adjustments based on what is found on his labs Fecal calprotectin will be helpful as well and should be obtained as able   Orders Placed This Encounter  Procedures   CBC with Differential/Platelet   Comprehensive metabolic panel   Sedimentation rate   CRP High sensitivity   IBC + Ferritin   Ambulatory referral to Gastroenterology    New Prescriptions   No medications on file   Modified Medications   Modified Medication Previous Medication   FERROUS SULFATE 325 (65 FE) MG EC TABLET ferrous sulfate 325 (65 FE) MG EC tablet      Take 1 tablet (325 mg total) by mouth 2 (two) times daily.    Take 1 tablet (325 mg total) by mouth daily.    Planned Follow Up No follow-ups on file.   Total Time in Face-to-Face and in Coordination of Care for patient including independent/personal interpretation/review of prior testing, medical history, examination, medication adjustment, communicating results with the patient directly, and documentation with the EHR is 25 minutes.   Justice Britain, MD [facility] Advanced Endoscopy Office # [phone]

## 2021-10-27 ENCOUNTER — Other Ambulatory Visit (HOSPITAL_COMMUNITY): Payer: Self-pay

## 2021-10-27 ENCOUNTER — Ambulatory Visit (AMBULATORY_SURGERY_CENTER): Payer: BC Managed Care – PPO | Admitting: Gastroenterology

## 2021-10-27 ENCOUNTER — Encounter: Payer: Self-pay | Admitting: Gastroenterology

## 2021-10-27 VITALS — BP 114/86 | HR 64 | Temp 98.7°F | Resp 20 | Ht 67.0 in | Wt 149.0 lb

## 2021-10-27 DIAGNOSIS — K5669 Other partial intestinal obstruction: Secondary | ICD-10-CM

## 2021-10-27 DIAGNOSIS — K2289 Other specified disease of esophagus: Secondary | ICD-10-CM | POA: Diagnosis not present

## 2021-10-27 DIAGNOSIS — K2100 Gastro-esophageal reflux disease with esophagitis, without bleeding: Secondary | ICD-10-CM

## 2021-10-27 DIAGNOSIS — K5100 Ulcerative (chronic) pancolitis without complications: Secondary | ICD-10-CM | POA: Diagnosis present

## 2021-10-27 DIAGNOSIS — K641 Second degree hemorrhoids: Secondary | ICD-10-CM

## 2021-10-27 DIAGNOSIS — K5190 Ulcerative colitis, unspecified, without complications: Secondary | ICD-10-CM | POA: Diagnosis not present

## 2021-10-27 DIAGNOSIS — K51019 Ulcerative (chronic) pancolitis with unspecified complications: Secondary | ICD-10-CM

## 2021-10-27 MED ORDER — SODIUM CHLORIDE 0.9 % IV SOLN: 500.0000 mL | Freq: Once | INTRAVENOUS | Status: AC

## 2021-10-27 MED ORDER — PANTOPRAZOLE SODIUM 40 MG PO TBEC: 40.0000 mg | DELAYED_RELEASE_TABLET | Freq: Every day | ORAL | 1 refills | Status: DC

## 2021-10-27 NOTE — Progress Notes (Signed)
A and O x3. Report to RN. Tolerated MAC anesthesia well.Teeth unchanged after procedure. 

## 2021-10-27 NOTE — Progress Notes (Signed)
Called to room to assist during endoscopic procedure.  Patient ID and intended procedure confirmed with present staff. Received instructions for my participation in the procedure from the performing physician.  

## 2021-10-27 NOTE — Patient Instructions (Addendum)
Handouts on esophagitis and hiatal hernia. Await pathology results. Repeat EGD in 4 months to ensure no Barrett's and esophagitis has healed  Restart PPI 40 mg once a day - pick up prescription from [facility] off of [address]  Use RectiCare anorectal cream  Proceed with ordering CT-Enterography vs MR-Enterography for further evaluation of colon and small bowel Repeat colonoscopy attempt with potential further dilations with neonatal scope and EGD scope/ultraslim colonoscope availability    YOU HAD AN ENDOSCOPIC PROCEDURE TODAY AT [facility]:   Refer to the procedure report that was given to you for any specific questions about what was found during the examination.  If the procedure report does not answer your questions, please call your gastroenterologist to clarify.  If you requested that your care partner not be given the details of your procedure findings, then the procedure report has been included in a sealed envelope for you to review at your convenience later.  YOU SHOULD EXPECT: Some feelings of bloating in the abdomen. Passage of more gas than usual.  Walking can help get rid of the air that was put into your GI tract during the procedure and reduce the bloating. If you had a lower endoscopy (such as a colonoscopy or flexible sigmoidoscopy) you may notice spotting of blood in your stool or on the toilet paper. If you underwent a bowel prep for your procedure, you may not have a normal bowel movement for a few days.  Please Note:  You might notice some irritation and congestion in your nose or some drainage.  This is from the oxygen used during your procedure.  There is no need for concern and it should clear up in a day or so.  SYMPTOMS TO REPORT IMMEDIATELY:  Following lower endoscopy (colonoscopy or flexible sigmoidoscopy):  Excessive amounts of blood in the stool  Significant tenderness or worsening of abdominal pains  Swelling of the abdomen that is  new, acute  Fever of 100F or higher  Following upper endoscopy (EGD)  Vomiting of blood or coffee ground material  New chest pain or pain under the shoulder blades  Painful or persistently difficult swallowing  New shortness of breath  Fever of 100F or higher  Black, tarry-looking stools  For urgent or emergent issues, a gastroenterologist can be reached at any hour by calling [phone]. Do not use MyChart messaging for urgent concerns.    DIET:  We do recommend a small meal at first, but then you may proceed to your regular diet.  Drink plenty of fluids but you should avoid alcoholic beverages for 24 hours.  ACTIVITY:  You should plan to take it easy for the rest of today and you should NOT DRIVE or use heavy machinery until tomorrow (because of the sedation medicines used during the test).    FOLLOW UP: Our staff will call the number listed on your records the next business day following your procedure.  We will call around 7:15- 8:00 am to check on you and address any questions or concerns that you may have regarding the information given to you following your procedure. If we do not reach you, we will leave a message.  If you develop any symptoms (ie: fever, flu-like symptoms, shortness of breath, cough etc.) before then, please call [phone].  If you test positive for Covid 19 in the 2 weeks post procedure, please call and report this information to [place].    If any biopsies were taken you will  be contacted by phone or by letter within the next 1-3 weeks.  Please call us at [phone] if you have not heard about the biopsies in 3 weeks.    SIGNATURES/CONFIDENTIALITY: You and/or your care partner have signed paperwork which will be entered into your electronic medical record.  These signatures attest to the fact that that the information above on your After Visit Summary has been reviewed and is understood.  Full responsibility of the confidentiality of this discharge  information lies with you and/or your care-partner.

## 2021-10-27 NOTE — Op Note (Signed)
[facility] Patient Name: Isaiah Carpenter Procedure Date: 10/27/2021 2:00 PM MRN: [id number] Endoscopist: Justice Britain , MD Age: 26 Referring MD:  Date of Birth: 18-Dec-1995 Gender: Male Account #: [id number] Procedure:                Upper GI endoscopy Indications:              Follow-up of esophagitis Medicines:                Monitored Anesthesia Care Procedure:                Pre-Anesthesia Assessment:                           - Prior to the procedure, a History and Physical                            was performed, and patient medications and                            allergies were reviewed. The patient's tolerance of                            previous anesthesia was also reviewed. The risks                            and benefits of the procedure and the sedation                            options and risks were discussed with the patient.                            All questions were answered, and informed consent                            was obtained. Prior Anticoagulants: The patient has                            taken no previous anticoagulant or antiplatelet                            agents. ASA Grade Assessment: II - A patient with                            mild systemic disease. After reviewing the risks                            and benefits, the patient was deemed in                            satisfactory condition to undergo the procedure.                           After obtaining informed consent, the endoscope was  passed under direct vision. Throughout the                            procedure, the patient's blood pressure, pulse, and                            oxygen saturations were monitored continuously. The                            Endoscope was introduced through the mouth, and                            advanced to the second part of duodenum. The upper                            GI endoscopy was accomplished  without difficulty.                            The patient tolerated the procedure. Scope In: Scope Out: Findings:                 No gross lesions were noted in the proximal                            esophagus and in the mid esophagus.                           LA Grade A (one or more mucosal breaks less than 5                            mm, not extending between tops of 2 mucosal folds)                            esophagitis with no bleeding was found in the                            distal esophagus.                           Two tongues of salmon-colored mucosa were present                            from 37 to 39 cm. No other visible abnormalities                            were present. Biopsies were taken with a cold                            forceps for histology.                           The Z-line was irregular and was found 39 cm from  the incisors.                           Multiple dispersed small erosions with no bleeding                            and no stigmata of recent bleeding were found in                            the cardia and in the gastric fundus.                           No gross lesions were noted in the entire examined                            stomach. Biopsies were taken with a cold forceps                            for histology and Helicobacter pylori testing.                           Patchy mildly erythematous mucosa without active                            bleeding and with no stigmata of bleeding was found                            in the duodenal bulb, in the first portion of the                            duodenum and in the second portion of the duodenum.                            Biopsies were taken with a cold forceps for                            histology. Complications:            No immediate complications. Estimated Blood Loss:     Estimated blood loss was minimal. Impression:               - No gross  lesions in esophagus proximally. LA                            Grade A reflux esophagitis with no bleeding                            distally. Salmon-colored mucosa suspicious for                            Barrett's esophagus distally. Biopsied.                           - Z-line irregular, 39 cm from the incisors.                           -  Erosive gastropathy with no bleeding and no                            stigmata of recent bleeding in cardia/fundus. No                            gross lesions in the stomach. Biopsied.                           - Erythematous duodenopathy. Biopsied. Recommendation:           - Proceed to scheduled colonoscopy.                           - Restart PPI 40 mg once daily.                           - Consider repeat EGD in 11-month to ensure no                            Barrett's and esophagitis has healed.                           - The findings and recommendations were discussed                            with the patient. GJustice Britain MD 10/27/2021 2:48:38 PM

## 2021-10-27 NOTE — Progress Notes (Signed)
GASTROENTEROLOGY PROCEDURE H&P NOTE   Primary Care Physician: Pcp, No  HPI: Isaiah Carpenter is a 26 y.o. male who presents for EGD/colonoscopy for follow-up of esophagitis grade C as well as ulcerative colitis now status post biologic therapy for disease activity assessment.  Past Medical History:  Diagnosis Date   Anemia    Past Surgical History:  Procedure Laterality Date   BIOPSY  08/04/2020   Procedure: BIOPSY;  Surgeon: Rush Landmark Telford Nab., MD;  Location: [facility];  Service: Gastroenterology;;   COLONOSCOPY     COLONOSCOPY WITH PROPOFOL N/A 08/04/2020   Procedure: COLONOSCOPY;  Surgeon: Irving Copas., MD;  Location: [facility];  Service: Gastroenterology;  Laterality: N/A;   ESOPHAGOGASTRODUODENOSCOPY N/A 08/04/2020   Procedure: ESOPHAGOGASTRODUODENOSCOPY (EGD);  Surgeon: Irving Copas., MD;  Location: [facility];  Service: Gastroenterology;  Laterality: N/A;   UPPER GASTROINTESTINAL ENDOSCOPY     Current Outpatient Medications  Medication Sig Dispense Refill   ferrous sulfate 325 (65 FE) MG EC tablet Take 1 tablet (325 mg total) by mouth 2 (two) times daily. 60 tablet 6   pantoprazole (PROTONIX) 20 MG tablet Take 1 tablet (20 mg total) by mouth daily. 30 tablet 2   acetaminophen (TYLENOL) 500 MG tablet Take 500-1,000 mg by mouth every 8 (eight) hours as needed for mild pain (or headaches).     Adalimumab (HUMIRA PEN) 40 MG/0.4ML PNKT Inject 40 mg into the skin every 7 (seven) days for 6 days. 4 each 6   Current Facility-Administered Medications  Medication Dose Route Frequency Provider Last Rate Last Admin   0.9 %  sodium chloride infusion  500 mL Intravenous Once Mansouraty, Telford Nab., MD        Current Outpatient Medications:    ferrous sulfate 325 (65 FE) MG EC tablet, Take 1 tablet (325 mg total) by mouth 2 (two) times daily., Disp: 60 tablet, Rfl: 6   pantoprazole (PROTONIX) 20 MG tablet, Take 1 tablet (20 mg total) by mouth daily.,  Disp: 30 tablet, Rfl: 2   acetaminophen (TYLENOL) 500 MG tablet, Take 500-1,000 mg by mouth every 8 (eight) hours as needed for mild pain (or headaches)., Disp: , Rfl:    Adalimumab (HUMIRA PEN) 40 MG/0.4ML PNKT, Inject 40 mg into the skin every 7 (seven) days for 6 days., Disp: 4 each, Rfl: 6  Current Facility-Administered Medications:    0.9 %  sodium chloride infusion, 500 mL, Intravenous, Once, Mansouraty, Telford Nab., MD Allergies  Allergen Reactions   Aleve [Naproxen Sodium] Anaphylaxis   Feraheme [Ferumoxytol] Shortness Of Breath    Patient had hot flushes and tight ness in ches within 5 minutes of iv infusion   Ibuprofen Anaphylaxis and Swelling   Family History  Problem Relation Age of Onset   Colon cancer Neg Hx    Esophageal cancer Neg Hx    Stomach cancer Neg Hx    Inflammatory bowel disease Neg Hx    Liver disease Neg Hx    Pancreatic cancer Neg Hx    Rectal cancer Neg Hx    Social History   Socioeconomic History   Marital status: Single    Spouse name: Not on file   Number of children: Not on file   Years of education: Not on file   Highest education level: Not on file  Occupational History   Not on file  Tobacco Use   Smoking status: Former    Packs/day: 0.50    Types: Cigarettes   Smokeless tobacco: Never  Vaping  Use   Vaping Use: Some days  Substance and Sexual Activity   Alcohol use: Yes    Comment: rarely   Drug use: Never   Sexual activity: Yes  Other Topics Concern   Not on file  Social History Narrative   Not on file   Social Determinants of Health   Financial Resource Strain: Not on file  Food Insecurity: Not on file  Transportation Needs: Not on file  Physical Activity: Not on file  Stress: Not on file  Social Connections: Not on file  Intimate Partner Violence: Not on file    Physical Exam: Today's Vitals   10/27/21 1234  BP: 112/62  Pulse: 64  Temp: 98.7 F (37.1 C)  TempSrc: Temporal  SpO2: 100%  Weight: 149 lb (67.6 kg)   Height: 5' 7"  (1.702 m)   Body mass index is 23.34 kg/m. GEN: NAD EYE: Sclerae anicteric ENT: MMM CV: Non-tachycardic GI: Soft, NT/ND NEURO:  Alert & Oriented x 3  Lab Results: No results for input(s): "WBC", "HGB", "HCT", "PLT" in the last 72 hours. BMET No results for input(s): "NA", "K", "CL", "CO2", "GLUCOSE", "BUN", "CREATININE", "CALCIUM" in the last 72 hours. LFT No results for input(s): "PROT", "ALBUMIN", "AST", "ALT", "ALKPHOS", "BILITOT", "BILIDIR", "IBILI" in the last 72 hours. PT/INR No results for input(s): "LABPROT", "INR" in the last 72 hours.   Impression / Plan: This is a 26 y.o.male who presents for EGD/colonoscopy for follow-up of esophagitis grade C as well as ulcerative colitis now status post biologic therapy for disease activity assessment.  The risks and benefits of endoscopic evaluation/treatment were discussed with the patient and/or family; these include but are not limited to the risk of perforation, infection, bleeding, missed lesions, lack of diagnosis, severe illness requiring hospitalization, as well as anesthesia and sedation related illnesses.  The patient's history has been reviewed, patient examined, no change in status, and deemed stable for procedure.  The patient and/or family is agreeable to proceed.    Justice Britain, MD [facility] Advanced Endoscopy Office # [phone]

## 2021-10-27 NOTE — Op Note (Signed)
[facility] Patient Name: Isaiah Carpenter Procedure Date: 10/27/2021 2:00 PM MRN: [id number] Endoscopist: Justice Britain , MD Age: 26 Referring MD:  Date of Birth: Jun 21, 1995 Gender: Male Account #: [id number] Procedure:                Colonoscopy Indications:              Obtain more precise diagnosis of inflammatory bowel                            disease, Follow-up of chronic ulcerative                            pancolitis, Disease activity assessment of chronic                            ulcerative pancolitis, Assess therapeutic response                            to therapy of chronic ulcerative pancolitis Medicines:                Monitored Anesthesia Care Procedure:                Pre-Anesthesia Assessment:                           - Prior to the procedure, a History and Physical                            was performed, and patient medications and                            allergies were reviewed. The patient's tolerance of                            previous anesthesia was also reviewed. The risks                            and benefits of the procedure and the sedation                            options and risks were discussed with the patient.                            All questions were answered, and informed consent                            was obtained. Prior Anticoagulants: The patient has                            taken no previous anticoagulant or antiplatelet                            agents. ASA Grade Assessment: II - A patient with  mild systemic disease. After reviewing the risks                            and benefits, the patient was deemed in                            satisfactory condition to undergo the procedure.                           After obtaining informed consent, the colonoscope                            was passed under direct vision. Throughout the                            procedure, the  patient's blood pressure, pulse, and                            oxygen saturations were monitored continuously. The                            Endoscope was introduced through the anus and                            advanced to the the sigmoid colon. The colonoscopy                            was extremely difficult due to bowel stenosis.                            Successful completion of the procedure was aided by                            performing the maneuvers documented (below) in this                            report. The patient tolerated the procedure. The                            quality of the bowel preparation was adequate. Scope In: Scope Out: Findings:                 The digital rectal exam findings include                            hemorrhoids and rectal stricture. Pertinent                            negatives include no palpable rectal lesions.                           Skin tags were found on perianal exam.                           A  benign-appearing, intrinsic severe stenosis                            measuring 2 cm (in length) x 8 mm (inner diameter)                            was found in the mid rectum (@ 6cm) and was                            traversed with pressure. After passage of the scope                            the area was examined and showed moderate mucosal                            disruption, moderate improvement in luminal                            narrowing and no perforation.                           A benign-appearing, intrinsic severe stenosis                            measuring 1 cm (in length) x 5 mm (inner diameter)                            was found in the distal rectum/rectosigmoid (@13                             cm) and was non-traversed even with pressure.                           Biopsies were taken with a cold forceps in the                            rectum for histology from the colon and stricture.                            Non-bleeding non-thrombosed external and internal                            hemorrhoids were found during perianal exam and                            during digital exam. The hemorrhoids were Grade II                            (internal hemorrhoids that prolapse but reduce                            spontaneously). Complications:            No immediate complications. Estimated Blood Loss:  Estimated blood loss was minimal. Impression:               - Hemorrhoids and rectal stricture found on digital                            rectal exam.                           - Perianal skin tags found on perianal exam.                           - Stricture in the mid rectum (@ 6 cm) and was able                            to be traversed with pressure. Mucosal wrent was                            noted.                           - Stricture in the distal rectum (@ 12-13 cm). This                            was not able to be traverswed.                           - Non-bleeding non-thrombosed external and internal                            hemorrhoids.                           - Biopsies were taken with a cold forceps for                            histology in the rectum and colon and strictures. Recommendation:           - The patient will be observed post-procedure,                            until all discharge criteria are met.                           - Discharge patient to home.                           - Patient has a contact number available for                            emergencies. The signs and symptoms of potential                            delayed complications were discussed with the  patient. Return to normal activities tomorrow.                            Written discharge instructions were provided to the                            patient.                           - Resume previous diet.                           - Continue present  medications.                           - Proceed with ordering CT-Enterography v                            MR-Enterography for further evaluation of colon and                            small bowel.                           - We have been considering patient to have                            underlying Panulcerative colitis, but with multiple                            stenoses, will need to consider potential Crohn's                            disease.                           - Await pathology results.                           - Repeat Colonoscopy attempt with potential further                            dilations with Neonatal scope and EGD                            scope/Ultraslim colonoscope availability.                           - The findings and recommendations were discussed                            with the patient. Justice Britain, MD 10/27/2021 3:00:36 PM

## 2021-10-28 ENCOUNTER — Telehealth: Payer: Self-pay | Admitting: *Deleted

## 2021-10-28 NOTE — Telephone Encounter (Signed)
  Follow up Call-     10/27/2021   12:35 PM  Call back number  Post procedure Call Back phone  # [phone]  Permission to leave phone message Yes     Patient questions:  Do you have a fever, pain , or abdominal swelling? No. Pain Score  0 *  Have you tolerated food without any problems? Yes.    Have you been able to return to your normal activities? Yes.    Do you have any questions about your discharge instructions: Diet   No. Medications  No. Follow up visit  No.  Do you have questions or concerns about your Care? No.  Actions: * If pain score is 4 or above: No action needed, pain <4.

## 2021-11-01 ENCOUNTER — Encounter: Payer: Self-pay | Admitting: Gastroenterology

## 2021-11-02 ENCOUNTER — Other Ambulatory Visit: Payer: Self-pay

## 2021-11-02 DIAGNOSIS — K51019 Ulcerative (chronic) pancolitis with unspecified complications: Secondary | ICD-10-CM

## 2021-11-11 ENCOUNTER — Encounter: Payer: Self-pay | Admitting: Gastroenterology

## 2021-11-12 ENCOUNTER — Ambulatory Visit (HOSPITAL_COMMUNITY): Payer: BC Managed Care – PPO

## 2021-12-13 ENCOUNTER — Ambulatory Visit (HOSPITAL_COMMUNITY): Payer: BC Managed Care – PPO

## 2021-12-13 ENCOUNTER — Encounter (HOSPITAL_COMMUNITY): Payer: Self-pay

## 2022-01-21 ENCOUNTER — Ambulatory Visit: Payer: BC Managed Care – PPO | Admitting: Gastroenterology

## 2022-04-25 ENCOUNTER — Other Ambulatory Visit: Payer: Self-pay | Admitting: Gastroenterology

## 2022-04-25 DIAGNOSIS — K51019 Ulcerative (chronic) pancolitis with unspecified complications: Secondary | ICD-10-CM

## 2022-04-25 NOTE — Telephone Encounter (Signed)
Dr Rush Landmark this pt has not been seen in office since 8/29 and procedure on 9/6 and is asking for humira refill. I do not see where a Percival Spanish has been done.  Ok to refill and order lab? I will also set up a follow up appt as well

## 2022-04-25 NOTE — Telephone Encounter (Signed)
[name], I am okay for the Humira refill for the next 2 to 3 months (since we are still booked). Please get him into see one of the APP's or myself. Please get the QuantiFERON gold done as well as a CBC/CMP/ESR/CRP. He also should do a fecal calprotectin. Thanks. GM

## 2022-04-26 MED ORDER — HUMIRA (2 PEN) 40 MG/0.4ML ~~LOC~~ AJKT: AUTO-INJECTOR | SUBCUTANEOUS | 3 refills | Status: DC

## 2022-04-26 NOTE — Addendum Note (Signed)
Addended by: Timothy Lasso on: 04/26/2022 09:31 AM   Modules accepted: Orders

## 2022-06-20 ENCOUNTER — Other Ambulatory Visit (HOSPITAL_COMMUNITY): Payer: Self-pay

## 2022-06-20 ENCOUNTER — Telehealth: Payer: Self-pay | Admitting: Pharmacy Technician

## 2022-06-20 NOTE — Telephone Encounter (Signed)
Patient Advocate Encounter  Received notification from [employer] that prior authorization for HUMIRA 40MG  is required.   PA submitted on 4.29.24 HOWEVER CURRENT PA ON FILE WITH EXP: 10/11/2022 Key [id number] Status is pending

## 2022-09-08 ENCOUNTER — Telehealth: Payer: Self-pay | Admitting: Gastroenterology

## 2022-09-08 NOTE — Telephone Encounter (Signed)
Raynelle Fanning from [employer] called regarding a quantity PA for Humira . She asked for a call back [phone]

## 2022-09-09 ENCOUNTER — Other Ambulatory Visit (HOSPITAL_COMMUNITY): Payer: Self-pay

## 2022-09-09 ENCOUNTER — Telehealth: Payer: Self-pay | Admitting: Pharmacy Technician

## 2022-09-09 NOTE — Telephone Encounter (Signed)
Pharmacy Patient Advocate Encounter  Received notification from [employer] that Prior Authorization for HUMIRA 40MG  has been APPROVED from 6.19.24 to 7.19.25.Marland Kitchen PA #/Case ID/Reference #: [id number]  Received notification from [employer] that Prior Authorization for QUANTITY has been APPROVED from 6.19.24 to 7.19.25. PA #/Case ID/Reference #: [id number]     Received notification from CoverMyMeds that prior authorization for HUMIRA 40MG  is required/requested.   Insurance verification completed.   The patient is insured through [employer] .   Per test claim: PA submitted to [employer] via CoverMyMeds Key/confirmation #/EOC [id number] Status is pending

## 2022-09-09 NOTE — Telephone Encounter (Signed)
Noted  

## 2022-09-13 ENCOUNTER — Other Ambulatory Visit (HOSPITAL_COMMUNITY): Payer: Self-pay

## 2022-09-13 MED ORDER — HUMIRA (2 PEN) 40 MG/0.4ML ~~LOC~~ AJKT: AUTO-INJECTOR | SUBCUTANEOUS | 3 refills | Status: DC

## 2022-09-13 NOTE — Telephone Encounter (Signed)
Prior authorization has been approved for Humira 40mg  for 4 pens per 28 days with effective dates of 09-09-2022 to 09-08-223. KEY: [id number]  Must be filled at specialty pharmacy

## 2022-09-13 NOTE — Telephone Encounter (Signed)
Prescription sent and pt advised

## 2022-09-24 ENCOUNTER — Emergency Department (HOSPITAL_COMMUNITY): Payer: BC Managed Care – PPO

## 2022-09-24 ENCOUNTER — Emergency Department (HOSPITAL_COMMUNITY)
Admission: EM | Admit: 2022-09-24 | Discharge: 2022-09-24 | Disposition: A | Discharge status: Home / Self Care | Payer: BC Managed Care – PPO | Attending: Emergency Medicine | Admitting: Emergency Medicine

## 2022-09-24 ENCOUNTER — Other Ambulatory Visit: Payer: Self-pay

## 2022-09-24 ENCOUNTER — Encounter (HOSPITAL_COMMUNITY): Payer: Self-pay

## 2022-09-24 DIAGNOSIS — S0181XA Laceration without foreign body of other part of head, initial encounter: Secondary | ICD-10-CM | POA: Insufficient documentation

## 2022-09-24 DIAGNOSIS — S0083XA Contusion of other part of head, initial encounter: Secondary | ICD-10-CM | POA: Insufficient documentation

## 2022-09-24 DIAGNOSIS — H7292 Unspecified perforation of tympanic membrane, left ear: Secondary | ICD-10-CM | POA: Insufficient documentation

## 2022-09-24 DIAGNOSIS — W268XXA Contact with other sharp object(s), not elsewhere classified, initial encounter: Secondary | ICD-10-CM | POA: Diagnosis not present

## 2022-09-24 DIAGNOSIS — Z23 Encounter for immunization: Secondary | ICD-10-CM | POA: Diagnosis not present

## 2022-09-24 DIAGNOSIS — S0993XA Unspecified injury of face, initial encounter: Secondary | ICD-10-CM | POA: Diagnosis present

## 2022-09-24 MED ORDER — LIDOCAINE-EPINEPHRINE (PF) 2 %-1:200000 IJ SOLN
10.0000 mL | Freq: Once | INTRAMUSCULAR | Status: DC
  Filled 2022-09-24: qty 20

## 2022-09-24 MED ORDER — TETANUS-DIPHTH-ACELL PERTUSSIS 5-2.5-18.5 LF-MCG/0.5 IM SUSY
0.5000 mL | PREFILLED_SYRINGE | Freq: Once | INTRAMUSCULAR | Status: AC
  Administered 2022-09-24: 0.5 mL via INTRAMUSCULAR
  Filled 2022-09-24: qty 0.5

## 2022-09-24 MED ORDER — OXYCODONE-ACETAMINOPHEN 5-325 MG PO TABS
1.0000 | ORAL_TABLET | Freq: Once | ORAL | Status: AC
  Administered 2022-09-24: 1 via ORAL
  Filled 2022-09-24: qty 1

## 2022-09-24 NOTE — ED Provider Notes (Signed)
LACERATION REPAIR Performed by: Claude Manges Authorized by: Claude Manges Consent: Verbal consent obtained. Risks and benefits: risks, benefits and alternatives were discussed Consent given by: patient Patient identity confirmed: provided demographic data Prepped and Draped in normal sterile fashion Wound explored  Laceration Location: chin  Laceration Length: 4cm  No Foreign Bodies seen or palpated  Anesthesia: local infiltration  Local anesthetic: lidocaine 1% with epinephrine  Anesthetic total: 5 ml  Irrigation method: syringe Amount of cleaning: standard  Skin closure: closed  Number of sutures: 5  Technique: simple interrupted  Patient tolerance: Patient tolerated the procedure well with no immediate complications.    Claude Manges, PA-C 09/24/22 2322    Gwyneth Sprout, MD 09/25/22 [phone]

## 2022-09-24 NOTE — ED Provider Notes (Addendum)
[place] EMERGENCY DEPARTMENT AT [facility] Provider Note   CSN: [id number] Arrival date & time: 09/24/22  2058     History  No chief complaint on file.   Isaiah Carpenter is a 27 y.o. male.  Patient is a 27 year old male with a history of ulcerative colitis on Humira who is presenting today after a fall.  Patient was going out to walk his dogs when one of his dogs walked across to him causing him to fall chin first into the ground.  He denies hitting his head but hit his chin very hard and has had blood coming out of his left ear ever since.  He did scrape up his left knee but reports it hurts minimally he has been able to walk without difficulty.  It is very painful when he opens his mouth and his left jaw area and notices that sounds are very loud from the left ear.  He denies any neck pain or headache.  No difficulty swallowing.  The history is provided by the patient.       Home Medications Prior to Admission medications   Medication Sig Start Date End Date Taking? Authorizing Provider  acetaminophen (TYLENOL) 500 MG tablet Take 500-1,000 mg by mouth every 8 (eight) hours as needed for mild pain (or headaches).    [provider]  adalimumab (HUMIRA, 2 PEN,) 40 MG/0.4ML pen INJECT 40 MG UNDER THE SKIN EVERY 7 DAYS. 09/13/22   Mansouraty, Netty Starring., MD  ferrous sulfate 325 (65 FE) MG EC tablet Take 1 tablet (325 mg total) by mouth 2 (two) times daily. 10/20/21   Mansouraty, Netty Starring., MD  pantoprazole (PROTONIX) 20 MG tablet Take 1 tablet (20 mg total) by mouth daily. 03/30/21   Mansouraty, Netty Starring., MD  pantoprazole (PROTONIX) 40 MG tablet Take 1 tablet (40 mg total) by mouth daily. 10/27/21   Mansouraty, Netty Starring., MD      Allergies    Aleve [naproxen sodium], Feraheme [ferumoxytol], and Ibuprofen    Review of Systems   Review of Systems  Physical Exam Updated Vital Signs BP (!) 140/76 (BP Location: Right Arm)   Pulse 67   Temp 98.6 F (37 C)  (Oral)   Resp 15   Wt 67.6 kg   SpO2 100%   BMI 23.34 kg/m  Physical Exam Vitals and nursing note reviewed.  Constitutional:      General: He is not in acute distress.    Appearance: He is well-developed.  HENT:     Head: Normocephalic and atraumatic.     Right Ear: Tympanic membrane normal.     Left Ear: Drainage present. Tympanic membrane is perforated.     Ears:     Comments: Blood in the left ear canal and perforation of the TM noted    Mouth/Throat:     Comments: Tenderness with palpation over the left TMJ and patient has trismus.  3 cm laceration noted to the chin Eyes:     Conjunctiva/sclera: Conjunctivae normal.     Pupils: Pupils are equal, round, and reactive to light.  Cardiovascular:     Rate and Rhythm: Normal rate and regular rhythm.     Heart sounds: No murmur heard. Pulmonary:     Effort: Pulmonary effort is normal. No respiratory distress.     Breath sounds: Normal breath sounds. No wheezing or rales.  Abdominal:     General: There is no distension.     Palpations: Abdomen is soft.  Tenderness: There is no abdominal tenderness. There is no guarding or rebound.  Musculoskeletal:        General: No tenderness. Normal range of motion.     Cervical back: Normal range of motion and neck supple. No spinous process tenderness or muscular tenderness.       Legs:  Skin:    General: Skin is warm and dry.     Findings: No erythema or rash.  Neurological:     Mental Status: He is alert and oriented to person, place, and time. Mental status is at baseline.  Psychiatric:        Behavior: Behavior normal.     ED Results / Procedures / Treatments   Labs (all labs ordered are listed, but only abnormal results are displayed) Labs Reviewed - No data to display  EKG None  Radiology CT Maxillofacial Wo Contrast  Result Date: 09/24/2022 CLINICAL DATA:  Facial trauma, knocked down by dog. EXAM: CT MAXILLOFACIAL WITHOUT CONTRAST TECHNIQUE: Multidetector CT imaging  of the maxillofacial structures was performed. Multiplanar CT image reconstructions were also generated. RADIATION DOSE REDUCTION: This exam was performed according to the departmental dose-optimization program which includes automated exposure control, adjustment of the mA and/or kV according to patient size and/or use of iterative reconstruction technique. COMPARISON:  Head CT earlier today FINDINGS: Osseous: No acute fracture of the nasal bone, zygomatic arches or mandibles. The temporomandibular joints are congruent. Intact maxilla and pterygoid plates. Small leftward nasal septal spur. Orbits: No orbital fracture.  No evidence of globe injury. Sinuses: No sinus fracture or hemosinus. Paranasal sinuses are clear. No mastoid effusion. Soft tissues: No confluent hematoma. Limited intracranial: Assessed on head CT earlier today. IMPRESSION: No acute facial bone fracture. Electronically Signed   By: Narda Rutherford M.D.   On: 09/24/2022 22:18   CT Cervical Spine Wo Contrast  Result Date: 09/24/2022 CLINICAL DATA:  Facial trauma, blunt EXAM: CT CERVICAL SPINE WITHOUT CONTRAST TECHNIQUE: Multidetector CT imaging of the cervical spine was performed without intravenous contrast. Multiplanar CT image reconstructions were also generated. RADIATION DOSE REDUCTION: This exam was performed according to the departmental dose-optimization program which includes automated exposure control, adjustment of the mA and/or kV according to patient size and/or use of iterative reconstruction technique. COMPARISON:  None Available. FINDINGS: Alignment: Normal Skull base and vertebrae: No acute fracture. No primary bone lesion or focal pathologic process. Soft tissues and spinal canal: No prevertebral fluid or swelling. No visible canal hematoma. Disc levels:  Normal Upper chest: Negative Other: None IMPRESSION: No acute bony abnormality. Electronically Signed   By: Charlett Nose M.D.   On: 09/24/2022 22:02   CT HEAD WO CONTRAST  ( )  Result Date: 09/24/2022 CLINICAL DATA:  Facial trauma, blunt EXAM: CT HEAD WITHOUT CONTRAST TECHNIQUE: Contiguous axial images were obtained from the base of the skull through the vertex without intravenous contrast. RADIATION DOSE REDUCTION: This exam was performed according to the departmental dose-optimization program which includes automated exposure control, adjustment of the mA and/or kV according to patient size and/or use of iterative reconstruction technique. COMPARISON:  None Available. FINDINGS: Brain: No acute intracranial abnormality. Specifically, no hemorrhage, hydrocephalus, mass lesion, acute infarction, or significant intracranial injury. Vascular: No hyperdense vessel or unexpected calcification. Skull: No acute calvarial abnormality. Sinuses/Orbits: No acute findings Other: None IMPRESSION: Normal study. Electronically Signed   By: Charlett Nose M.D.   On: 09/24/2022 22:00    Procedures Procedures    Medications Ordered in ED Medications  lidocaine-EPINEPHrine (XYLOCAINE W/EPI)  2 %-1:200000 (PF) injection 10 mL (has no administration in time range)  oxyCODONE-acetaminophen (PERCOCET/ROXICET) 5-325 MG per tablet 1 tablet (has no administration in time range)  Tdap (BOOSTRIX) injection 0.5 mL (0.5 mLs Intramuscular Given 09/24/22 2203)    ED Course/ Medical Decision Making/ A&P                                 Medical Decision Making Amount and/or Complexity of Data Reviewed Radiology: ordered and independent interpretation performed. Decision-making details documented in ED Course.  Risk Prescription drug management.   Patient presenting today after a fall with jaw pain and blood in the left ear canal.  Patient does have evidence of perforated TM on the left and has jaw pain concerning for possible mandibular fracture.  He denies hitting his head or loss of consciousness.  He denies significant headache or neck pain.  However given mechanism and extent of injury will  do a CT of the head cervical spine and face to evaluate for fracture.  I have independently visualized and interpreted pt's images today.  CT of the head without evidence of fracture or bleed, CT cervical and maxillofacial are negative for acute fractures.  Findings discussed with the patient.  He was given precautions for ruptured TM and follow-up with ENT as needed.  He will take Tylenol up home as needed for the pain.  He was also use a soft diet until pain improves.  No indication for further testing at this time and patient appears stable for discharge.  Chin laceration repaired by PA Soto         Final Clinical Impression(s) / ED Diagnoses Final diagnoses:  Perforation of left tympanic membrane  Chin laceration, initial encounter  Contusion of jaw, initial encounter    Rx / DC Orders ED Discharge Orders     None         Gwyneth Sprout, MD 09/24/22 2308    Gwyneth Sprout, MD 09/24/22 2320

## 2022-09-24 NOTE — Discharge Instructions (Signed)
Avoid getting any water in your ear and do not put anything in your ear.  You can put a cottonball in just to clean the blood from the outside.  Use Tylenol as needed for pain and a soft diet such as smoothies, milkshakes, mashed potatoes until pain improves.  You will notice sounds on the left side will be extremely loud or muffled while this is healing but again if symptoms are not improving follow-up with ear nose and throat in 1 to 2 weeks for recheck.  Also the stitches need to come out in about 7 days from now.

## 2022-09-24 NOTE — ED Triage Notes (Signed)
Pt arrives with c/o head injury after he was pulled down by his dog and hit his head. Pt has a chin lac and blood coming from his left ear. Pt endorses headache and dizziness after head injury. Pt denies vision changes.

## 2022-09-24 NOTE — ED Notes (Signed)
With CT 

## 2023-02-01 ENCOUNTER — Other Ambulatory Visit: Payer: Self-pay

## 2023-02-01 MED ORDER — HUMIRA (2 PEN) 40 MG/0.4ML ~~LOC~~ AJKT: AUTO-INJECTOR | SUBCUTANEOUS | 3 refills | Status: DC

## 2023-02-01 NOTE — Addendum Note (Signed)
Addended by: Loretha Stapler on: 02/01/2023 02:53 PM   Modules accepted: Orders

## 2023-02-03 ENCOUNTER — Other Ambulatory Visit: Payer: Self-pay

## 2023-02-03 MED ORDER — HUMIRA (2 PEN) 40 MG/0.4ML ~~LOC~~ AJKT: AUTO-INJECTOR | SUBCUTANEOUS | 3 refills | Status: DC

## 2023-02-06 ENCOUNTER — Other Ambulatory Visit: Payer: Self-pay

## 2023-02-06 MED ORDER — HUMIRA (2 PEN) 40 MG/0.4ML ~~LOC~~ AJKT: AUTO-INJECTOR | SUBCUTANEOUS | 3 refills | Status: DC

## 2023-02-20 ENCOUNTER — Telehealth: Payer: Self-pay | Admitting: Gastroenterology

## 2023-02-20 NOTE — Telephone Encounter (Signed)
Inbound call from [facility] pharmacy stating that a form needs to be sent to cover my meds for patient to receive refill for Humira medication. Please advise, thank you.

## 2023-02-23 NOTE — Telephone Encounter (Signed)
 Message letting pt know that all information was sent to the pharmacy before christmas. He has been asked if he needs anything further.

## 2023-02-26 IMAGING — CT CT ABD-PELV W/ CM
2 of 4 series · 16 of 46 positions shown, 18 images · IV contrast (omnipaque)
Comparison: None.

CLINICAL DATA: Right lower quadrant abdominal pain, 2 months of
constipation with intermittent urgency

EXAM:
CT ABDOMEN AND PELVIS WITH CONTRAST
TECHNIQUE: Multidetector CT imaging of the abdomen and pelvis was performed
using the standard protocol following bolus administration of
intravenous contrast.
CONTRAST:  100mL OMNIPAQUE IOHEXOL 300 MG/ML  SOLN

[Series 3: a/p w/ 5mm · axial · 0.60mm/px · z∈[+912,+1342]mm · 13 of 94 slices shown, 15 images]
[im 4/94  soft-tissue]
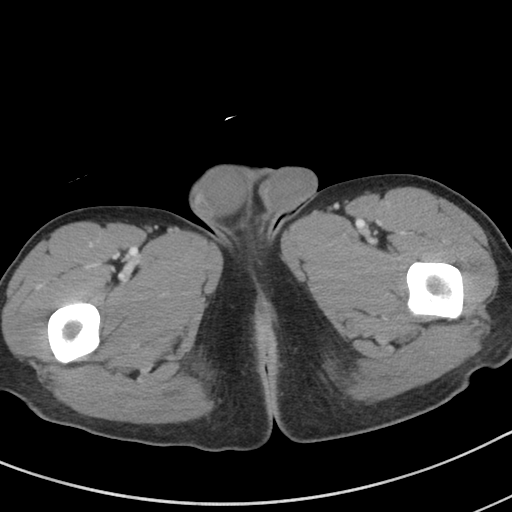
[im 4/94  bone]
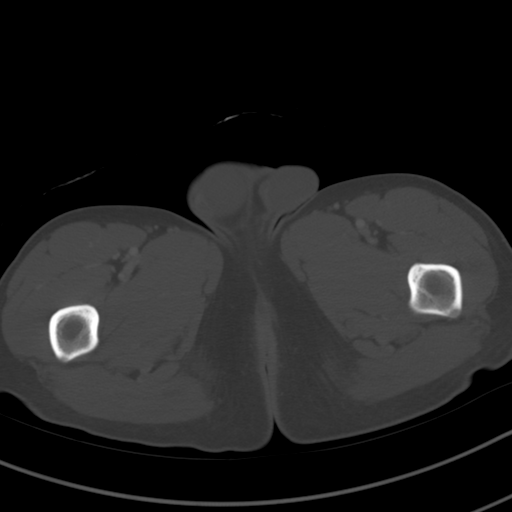
[im 12/94  soft-tissue]
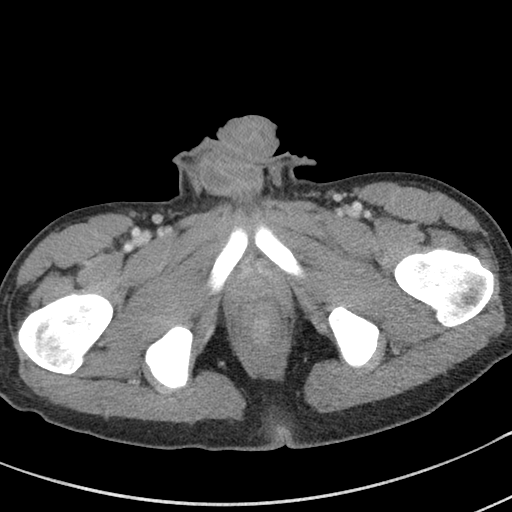
[im 19/94  soft-tissue]
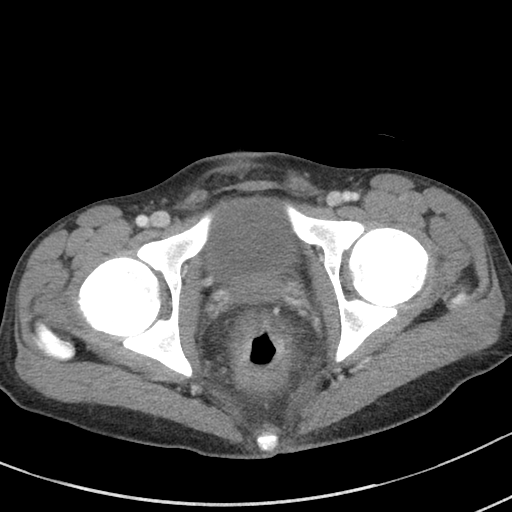
[im 27/94  soft-tissue]
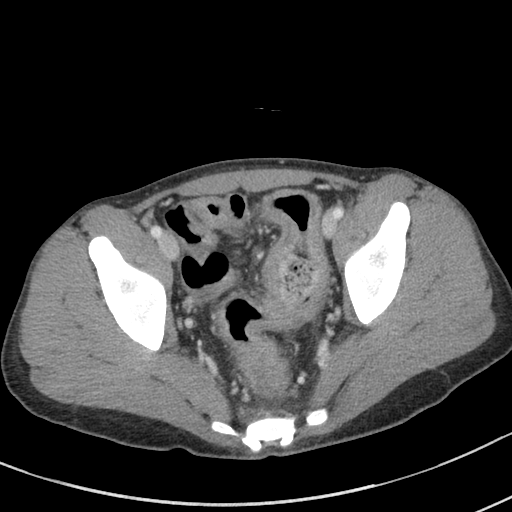
[im 34/94  soft-tissue]
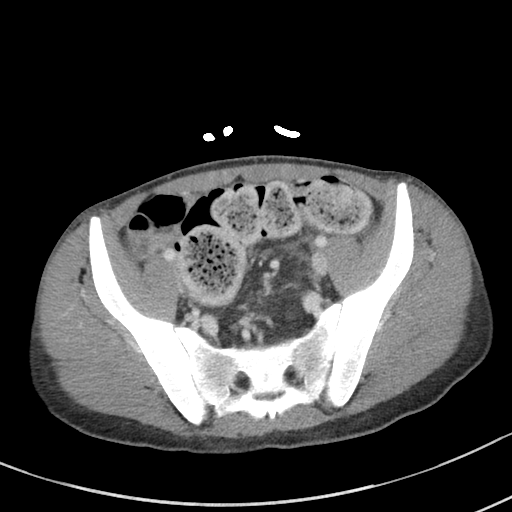
[im 41/94  soft-tissue]
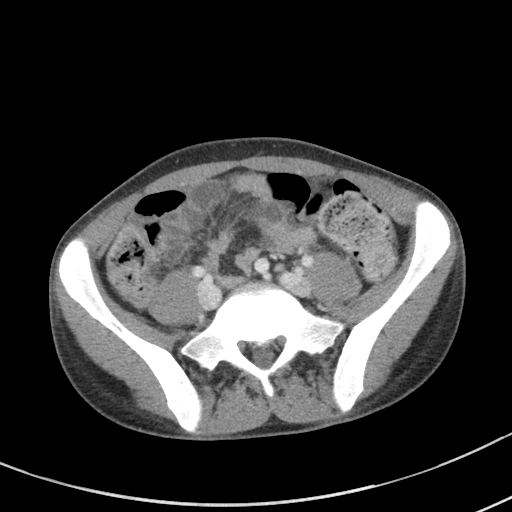
[im 49/94  soft-tissue]
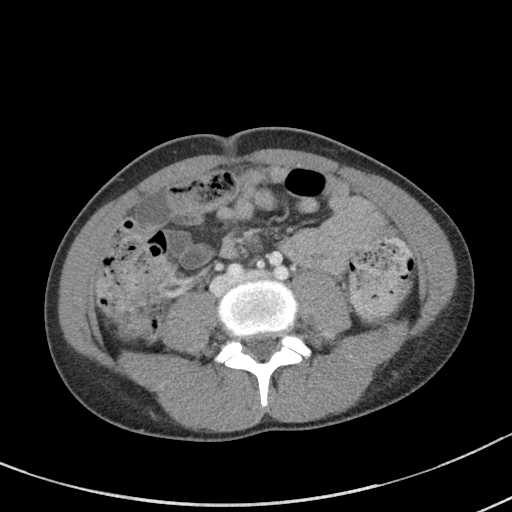
[im 53/94  soft-tissue]
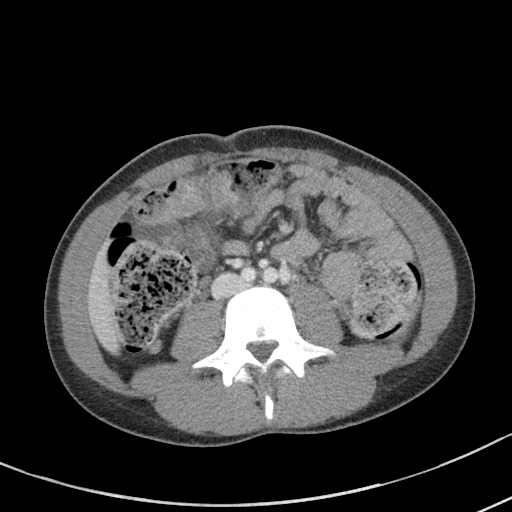
[im 60/94  soft-tissue]
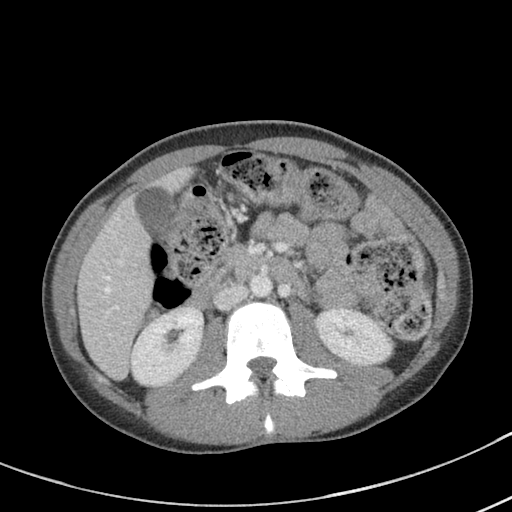
[im 60/94  bone]
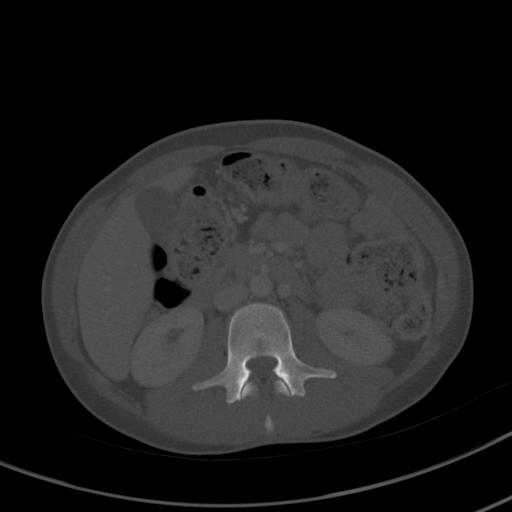
[im 67/94  soft-tissue]
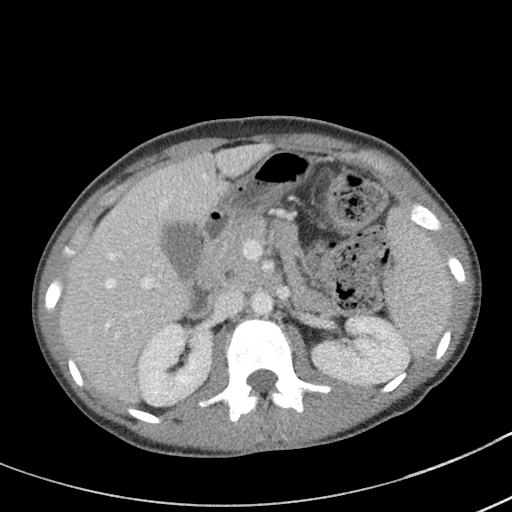
[im 75/94  soft-tissue]
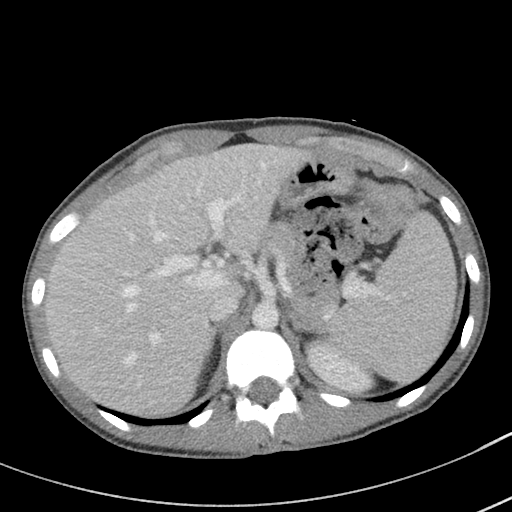
[im 82/94  soft-tissue]
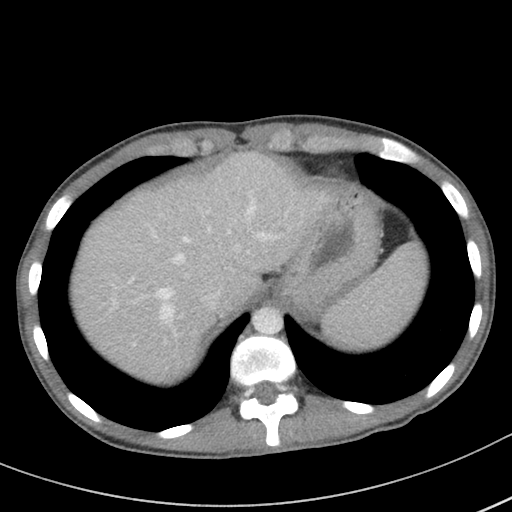
[im 90/94  soft-tissue]
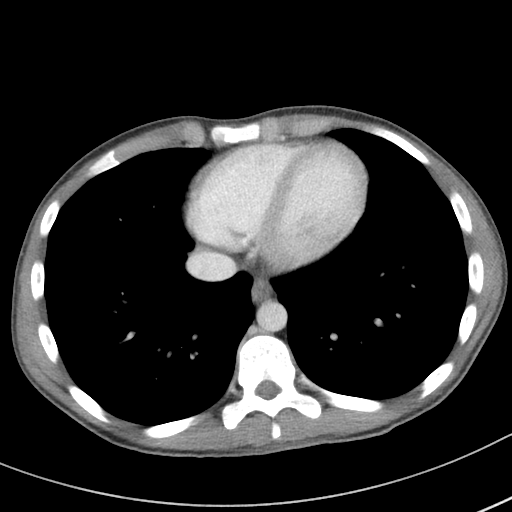

[Series 6: a/p w/ cor · coronal · 0.75mm/px · 3 of 113 slices shown]
[im 38/113  soft-tissue]
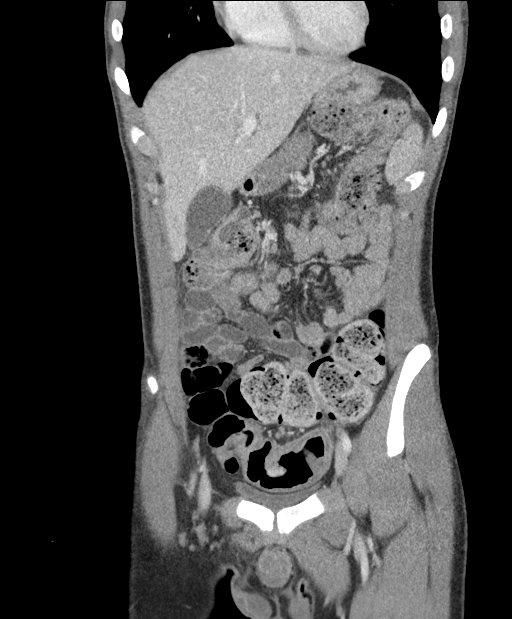
[im 50/113  soft-tissue]
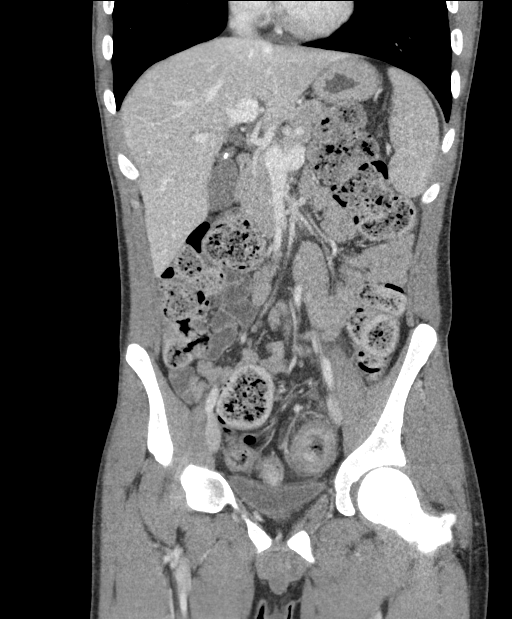
[im 63/113  soft-tissue]
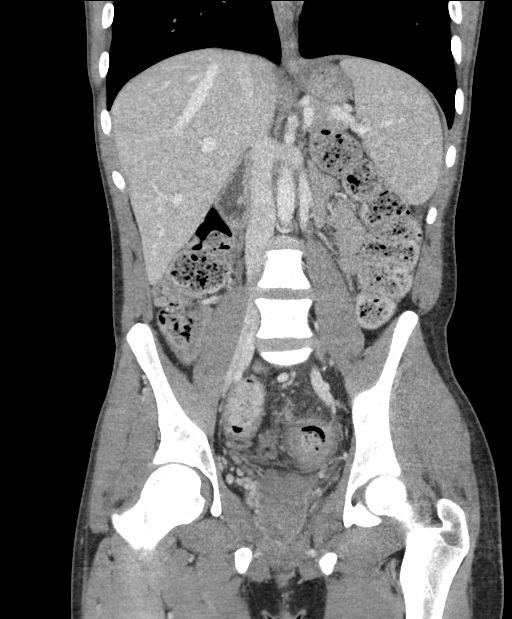

[16 of 46 positions shown; findings below may reference images not displayed]

FINDINGS: Lower chest: No acute abnormality. Normal size heart. No significant
pericardial effusion/thickening.

Hepatobiliary: No suspicious hepatic lesion. Cholelithiasis without
findings of acute cholecystitis. No biliary ductal dilation.

Pancreas: Within normal limits.

Spleen: Within normal limits.

Adrenals/Urinary Tract: Adrenal glands are unremarkable. Kidneys are
normal, without renal calculi, focal lesion, or hydronephrosis.
Bladder is unremarkable.

Stomach/Bowel: Symmetric wall thickening involving the rectum
extending to the level of the mid sigmoid colon for instance on
coronal image 54 and axial image 69. Large volume of formed stool
throughout the remainder of the colon. The appendix is grossly
unremarkable. The terminal ileum is unremarkable. No pathologic
dilation of small bowel. Stomach is grossly unremarkable.

Vascular/Lymphatic: No significant vascular findings are present.
Prominent mesorectal lymph nodes measuring up to 5 mm on image 65/3,
favored reactive. No pathologically enlarged abdominal or pelvic
lymph nodes.

Reproductive: Prostate is unremarkable.

Other: No abdominopelvic ascites.

Musculoskeletal: No acute or significant osseous findings.
IMPRESSION: 1. Symmetric wall thickening involving the rectum extending to the
level of the mid sigmoid colon with some adjacent inflammatory
stranding and prominent mesorectal lymph nodes. Findings most
consistent with infectious or inflammatory proctitis/colitis
including ulcerative colitis. Gastroenterology consult suggested.
2. Large volume of formed stool throughout the remainder of the
colon suggesting constipation.
3. Cholelithiasis without findings of acute cholecystitis.

## 2023-04-15 ENCOUNTER — Ambulatory Visit: Payer: Self-pay

## 2023-08-04 ENCOUNTER — Telehealth: Payer: Self-pay

## 2023-08-04 ENCOUNTER — Ambulatory Visit: Admitting: Gastroenterology

## 2023-08-04 ENCOUNTER — Encounter: Payer: Self-pay | Admitting: Gastroenterology

## 2023-08-04 ENCOUNTER — Other Ambulatory Visit (INDEPENDENT_AMBULATORY_CARE_PROVIDER_SITE_OTHER)

## 2023-08-04 VITALS — BP 96/70 | HR 76 | Ht 66.75 in | Wt 175.2 lb

## 2023-08-04 DIAGNOSIS — Z79620 Long term (current) use of immunosuppressive biologic: Secondary | ICD-10-CM

## 2023-08-04 DIAGNOSIS — K56699 Other intestinal obstruction unspecified as to partial versus complete obstruction: Secondary | ICD-10-CM | POA: Diagnosis not present

## 2023-08-04 DIAGNOSIS — Z862 Personal history of diseases of the blood and blood-forming organs and certain disorders involving the immune mechanism: Secondary | ICD-10-CM

## 2023-08-04 DIAGNOSIS — K2100 Gastro-esophageal reflux disease with esophagitis, without bleeding: Secondary | ICD-10-CM

## 2023-08-04 DIAGNOSIS — K50919 Crohn's disease, unspecified, with unspecified complications: Secondary | ICD-10-CM | POA: Diagnosis not present

## 2023-08-04 DIAGNOSIS — K529 Noninfective gastroenteritis and colitis, unspecified: Secondary | ICD-10-CM

## 2023-08-04 DIAGNOSIS — Z5181 Encounter for therapeutic drug level monitoring: Secondary | ICD-10-CM

## 2023-08-04 LAB — COMPREHENSIVE METABOLIC PANEL WITH GFR
ALT: 13 U/L (ref 0–53)
AST: 13 U/L (ref 0–37)
Albumin: 4.7 g/dL (ref 3.5–5.2)
Alkaline Phosphatase: 41 U/L (ref 39–117)
BUN: 8 mg/dL (ref 6–23)
CO2: 32 meq/L (ref 19–32)
Calcium: 10.4 mg/dL (ref 8.4–10.5)
Chloride: 102 meq/L (ref 96–112)
Creatinine, Ser: 1.01 mg/dL (ref 0.40–1.50)
GFR: 102.8 mL/min (ref >60.00)
Glucose, Bld: 115 mg/dL — ABNORMAL HIGH (ref 70–99)
Potassium: 4.1 meq/L (ref 3.5–5.1)
Sodium: 139 meq/L (ref 135–145)
Total Bilirubin: 0.7 mg/dL (ref 0.2–1.2)
Total Protein: 7.5 g/dL (ref 6.0–8.3)

## 2023-08-04 LAB — CBC
HCT: 43.6 % (ref 39.0–52.0)
Hemoglobin: 14.7 g/dL (ref 13.0–17.0)
MCHC: 33.7 g/dL (ref 30.0–36.0)
MCV: 91 fl (ref 78.0–100.0)
Platelets: 278 10*3/uL (ref 150.0–400.0)
RBC: 4.79 Mil/uL (ref 4.22–5.81)
RDW: 14.1 % (ref 11.5–15.5)
WBC: 9.5 10*3/uL (ref 4.0–10.5)

## 2023-08-04 LAB — C-REACTIVE PROTEIN: CRP: <1 mg/dL (ref 0.5–20.0)

## 2023-08-04 LAB — SEDIMENTATION RATE: Sed Rate: 1 mm/h (ref 0–15)

## 2023-08-04 MED ORDER — HUMIRA (2 PEN) 40 MG/0.4ML ~~LOC~~ AJKT: AUTO-INJECTOR | SUBCUTANEOUS | 3 refills | Status: DC

## 2023-08-04 NOTE — Telephone Encounter (Signed)
-----   Message from [facility] sent at 08/04/2023  3:45 PM EDT ----- Isaiah Carpenter ,  Dr.Mansouraty wanted me to send you a message to refill this patient's Humira .

## 2023-08-04 NOTE — Patient Instructions (Signed)
 Your provider has requested that you go to the basement level for lab work before leaving today. Press B on the elevator. The lab is located at the first door on the left as you exit the elevator.  You have been scheduled for a colonoscopy. Please follow written instructions given to you at your visit today.   If you use inhalers (even only as needed), please bring them with you on the day of your procedure.  DO NOT TAKE 7 DAYS PRIOR TO TEST- Trulicity (dulaglutide) Ozempic, Wegovy (semaglutide) Mounjaro (tirzepatide) Bydureon Bcise (exanatide extended release)  DO NOT TAKE 1 DAY PRIOR TO YOUR TEST Rybelsus (semaglutide) Adlyxin (lixisenatide) Victoza (liraglutide) Byetta (exanatide) ___________________________________________________________________________   _______________________________________________________  If your blood pressure at your visit was 140/90 or greater, please contact your primary care physician to follow up on this.  _______________________________________________________  If you are age 61 or older, your body mass index should be between 23-30. Your Body mass index is 27.65 kg/m. If this is out of the aforementioned range listed, please consider follow up with your Primary Care Provider.  If you are age 22 or younger, your body mass index should be between 19-25. Your Body mass index is 27.65 kg/m. If this is out of the aformentioned range listed, please consider follow up with your Primary Care Provider.   ________________________________________________________  The [place] [facility] providers would like to encourage you to use MYCHART to communicate with providers for non-urgent requests or questions.  Due to long hold times on the telephone, sending your provider a message by [facility] may be a faster and more efficient way to get a response.  Please allow 48 business hours for a response.  Please remember that this is for non-urgent requests.   _______________________________________________________   It was a pleasure to see you today!  Thank you for trusting me with your gastrointestinal care!

## 2023-08-04 NOTE — Progress Notes (Unsigned)
 GASTROENTEROLOGY OUTPATIENT CLINIC VISIT   Primary Care Provider Pcp, No No address on file None   Patient Profile: Isaiah Carpenter is a 28 y.o. 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  The patient presents to the [facility] for an evaluation and management of problem(s) noted below:  Problem List 1. Inflammatory bowel disease   2. Colon stricture (HCC)   3. Gastroesophageal reflux disease with esophagitis without hemorrhage   4. History of anemia   5. Long term (current) use of immunosuppressive biologic    Discussed the use of AI scribe software for clinical note transcription with the patient, who gave verbal consent to proceed.  History of Present Illness Please see prior notes for full details of HPI.  Interval History History of Present Illness   The patient is seen for follow-up today.  He feels significantly better now that he has been on Humira  for a few months.  He has not had a flare in weeks.  He has had some rectal discomfort and irritation from his presumed hemorrhoids.  He is wondering as to whether there may be some therapies for this.  Otherwise he feels like he is back to his normal self.  Much of the weight that he had gained while on prednisone  as gone away.  IBD diary BMs per day -between 1 and 2  Nocturnal BMs -none Blood -none Mucous -none Tenesmus -none Urgency -none Skin Manifestations -denies Eye Manifestations -denies Joint Manifestations -denies  GI Review of Systems Positive as above  Negative for odynophagia, dysphagia, nausea, vomiting, abdominal pain   Review of Systems General: Denies fevers/chills/unintentional weight loss HEENT: Denies oral lesions Cardiovascular: Denies chest pain Pulmonary: Denies shortness of  breath Gastroenterological: See HPI Genitourinary: Denies darkened urine Hematological: Denies easy bruising/bleeding Dermatological: Denies jaundice or skin nodules or new rashes Psychological: Mood is stable Allergy & Immunology: No new allergies Musculoskeletal: Denies progressive arthralgias   Medications Current Outpatient Medications  Medication Sig Dispense Refill   acetaminophen  (TYLENOL ) 500 MG tablet Take 500-1,000 mg by mouth every 8 (eight) hours as needed for mild pain (or headaches).     ferrous sulfate  325 (65 FE) MG EC tablet Take 1 tablet (325 mg total) by mouth 2 (two) times daily. 60 tablet 6   adalimumab  (HUMIRA , 2 PEN,) 40 MG/0.4ML pen INJECT 40 MG UNDER THE SKIN EVERY 7 DAYS. 4 each 3   ferrous gluconate (FERGON) 324 MG tablet Take 1 tablet (324 mg total) by mouth daily with breakfast. 30 tablet 12   Current Facility-Administered Medications  Medication Dose Route Frequency Provider Last Rate Last Admin   0.9 %  sodium chloride  infusion  500 mL Intravenous Once Mansouraty, Nashali Ditmer Jr., MD        Allergies Allergies  Allergen Reactions   Aleve [Naproxen Sodium] Anaphylaxis   Feraheme  [Ferumoxytol ] Shortness Of Breath    Patient had hot flushes and tight ness in ches within 5 minutes of iv infusion   Ibuprofen Anaphylaxis and Swelling    Histories Past Medical History:  Diagnosis Date   Anemia    Past Surgical History:  Procedure Laterality Date   BIOPSY  08/04/2020   Procedure: BIOPSY;  Surgeon: Normie Becton., MD;  Location: [facility] ENDOSCOPY;  Service: Gastroenterology;;   COLONOSCOPY     COLONOSCOPY WITH PROPOFOL  N/A 08/04/2020  Procedure: COLONOSCOPY;  Surgeon: Brice Campi Albino Alu., MD;  Location: [facility] ENDOSCOPY;  Service: Gastroenterology;  Laterality: N/A;   ESOPHAGOGASTRODUODENOSCOPY N/A 08/04/2020   Procedure: ESOPHAGOGASTRODUODENOSCOPY (EGD);  Surgeon: Normie Becton., MD;  Location: [facility] ENDOSCOPY;  Service: Gastroenterology;   Laterality: N/A;   UPPER GASTROINTESTINAL ENDOSCOPY     Social History   Socioeconomic History   Marital status: Single    Spouse name: Not on file   Number of children: Not on file   Years of education: Not on file   Highest education level: Not on file  Occupational History   Not on file  Tobacco Use   Smoking status: Former    Current packs/day: 0.50    Types: Cigarettes   Smokeless tobacco: Never  Vaping Use   Vaping status: Some Days  Substance and Sexual Activity   Alcohol use: Yes    Comment: rarely   Drug use: Never   Sexual activity: Yes  Other Topics Concern   Not on file  Social History Narrative   Not on file   Social Drivers of Health   Financial Resource Strain: Not on file  Food Insecurity: Not on file  Transportation Needs: Not on file  Physical Activity: Not on file  Stress: Not on file  Social Connections: Unknown (07/06/2021)   [facility]   Social Network    Social Network: Not on file  Intimate Partner Violence: Unknown (05/28/2021)   Received from [facility]   HITS    Physically Hurt: Not on file    Insult or Talk Down To: Not on file    Threaten Physical Harm: Not on file    Scream or Curse: Not on file   Family History  Problem Relation Age of Onset   Colon cancer Neg Hx    Esophageal cancer Neg Hx    Stomach cancer Neg Hx    Inflammatory bowel disease Neg Hx    Liver disease Neg Hx    Pancreatic cancer Neg Hx    Rectal cancer Neg Hx    I have reviewed his medical, social, and family history in detail and updated the electronic medical record as necessary.    PHYSICAL EXAMINATION  BP 96/70 (BP Location: Left Arm, Patient Position: Sitting)   Pulse 76 Comment: irregular  Ht 5' 6.75 (1.695 m) Comment: height measured without shoes  Wt 175 lb 4 oz (79.5 kg)   BMI 27.65 kg/m  Wt Readings from Last 3 Encounters:  08/04/23 175 lb 4 oz (79.5 kg)  09/24/22 149 lb (67.6 kg)  10/27/21 149 lb (67.6 kg)  GEN: NAD,  appears stated age, doesn't appear chronically ill, he looks healthier than he did previously PSYCH: Cooperative, without pressured speech EYE: Conjunctivae pink, sclerae anicteric ENT: MMM, no oral ulcers or lesions CV: Nontachycardic RESP: No audible wheezing GI: NABS, soft, rounded abdomen, NT, nondistended, without rebound or guarding MSK/EXT: No lower extremity edema SKIN: No jaundice, no concerning rashes NEURO:  Alert & Oriented x 3, no focal deficits   REVIEW OF DATA  I reviewed the following data at the time of this encounter:  GI Procedures and Studies  2023 colonoscopy - Hemorrhoids and rectal stricture found on digital rectal exam. - Perianal skin tags found on perianal exam. - Stricture in the mid rectum (@ 6 cm) and was able to be traversed with pressure. Mucosal wrent was noted. - Stricture in the distal rectum (@ 12-13 cm). This was not able to be traversed. -  Non-bleeding non-thrombosed external and internal hemorrhoids. - Biopsies were taken with a cold forceps for histology in the rectum and colon and strictures  2023 EGD - No gross lesions in esophagus proximally. LA Grade A reflux esophagitis with no bleeding distally. Salmon-colored mucosa suspicious for Barrett's esophagus distally. Biopsied. - Z-line irregular, 39 cm from the incisors. - Erosive gastropathy with no bleeding and no stigmata of recent bleeding in cardia/fundus. No gross lesions in the stomach. Biopsied. - Erythematous duodenopathy. Biopsied.  Laboratory Studies  Reviewed those in epic  Imaging Studies  No new studies to review   ASSESSMENT  Mr. Nichol is a 28 y.o. 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    The patient is seen today for evaluation and management of:  1. Inflammatory bowel disease   2. Colon  stricture (HCC)   3. Gastroesophageal reflux disease with esophagitis without hemorrhage   4. History of anemia   5. Long term (current) use of immunosuppressive biologic    The patient is hemodynamically stable.  Clinically, it seems like he is doing quite well over the course of the last year and a half since her last evaluation with him.  He remains on Humira , without immunomodulation.  His initial presentation has been more concerning for left-sided ulcerative colitis, but with the significant findings on his repeat colonoscopy in 2023 and inability to evaluate the entirety of the colon, my concern for potential Crohn's disease was much higher.  This remains the case but, he seems to be doing quite well on his current dosing.  We will get a CT enterography and updated laboratories as well as fecal calprotectin to see how he is doing overall.  Will plan to proceed with colonoscopy and do this in the hospital-based setting so that we have the ability to have ultraslim colonoscope as well as neonatal endoscope if needed to traverse stricturing disease.  I will add on an upper endoscopy at the same time due to his history of persisting grade esophagitis on prior endoscopies.  The risks and benefits of endoscopic evaluation were discussed with the patient; these include but are not limited to the risk of perforation, infection, bleeding, missed lesions, lack of diagnosis, severe illness requiring hospitalization, as well as anesthesia and sedation related illnesses.  The patient and/or family is agreeable to proceed.  Hopefully his prior anemia has improved, I suspect show based on how he is feeling at this time.  All patient questions were answered to the best of my ability, and the patient agrees to the aforementioned plan of action with follow-up as indicated.   PLAN  Continue Humira  every 2 weeks - Will likely pursue TDM - We will consider 6-MP/azathioprine restart in future Schedule colonoscopy/EGD  hospital-based setting Laboratories as outlined below Continue oral iron daily and will make adjustments based on what is found on his labs Fecal calprotectin to be obtained Proceed with ordering CT enterography to further evaluate colon in the setting of previous colonic stricturing disease   Orders Placed This Encounter  Procedures   Procedural/ Surgical Case Request: COLONOSCOPY   Calprotectin, Fecal   CBC   Comp Met (CMET)   Sed Rate (ESR)   C-reactive protein   IBC + Ferritin   QuantiFERON-TB Gold Plus   Ambulatory referral to Gastroenterology    New Prescriptions   FERROUS  GLUCONATE (FERGON) 324 MG TABLET    Take 1 tablet (324 mg total) by mouth daily with breakfast.   Modified Medications   Modified Medication Previous Medication   ADALIMUMAB  (HUMIRA , 2 PEN,) 40 MG/0.4ML PEN adalimumab  (HUMIRA , 2 PEN,) 40 MG/0.4ML pen      INJECT 40 MG UNDER THE SKIN EVERY 7 DAYS.    INJECT 40 MG UNDER THE SKIN EVERY 7 DAYS.    Planned Follow Up No follow-ups on file.   Total Time in Face-to-Face and in Coordination of Care for patient including independent/personal interpretation/review of prior testing, medical history, examination, medication adjustment, communicating results with the patient directly, and documentation with the EHR is 45 minutes.   Yong Henle, MD [facility] Office # [phone]

## 2023-08-05 ENCOUNTER — Encounter: Payer: Self-pay | Admitting: Gastroenterology

## 2023-08-05 DIAGNOSIS — K529 Noninfective gastroenteritis and colitis, unspecified: Secondary | ICD-10-CM | POA: Insufficient documentation

## 2023-08-05 DIAGNOSIS — Z79620 Long term (current) use of immunosuppressive biologic: Secondary | ICD-10-CM | POA: Insufficient documentation

## 2023-08-05 DIAGNOSIS — K56699 Other intestinal obstruction unspecified as to partial versus complete obstruction: Secondary | ICD-10-CM | POA: Insufficient documentation

## 2023-08-05 DIAGNOSIS — Z862 Personal history of diseases of the blood and blood-forming organs and certain disorders involving the immune mechanism: Secondary | ICD-10-CM | POA: Insufficient documentation

## 2023-08-05 LAB — IBC + FERRITIN
Ferritin: 21 ng/mL — ABNORMAL LOW (ref 22.0–322.0)
Iron: 95 ug/dL (ref 42–165)
Saturation Ratios: 22.8 % (ref 20.0–50.0)
TIBC: 417.2 ug/dL (ref 250.0–450.0)
Transferrin: 298 mg/dL (ref 212.0–360.0)

## 2023-08-06 ENCOUNTER — Ambulatory Visit: Payer: Self-pay | Admitting: Gastroenterology

## 2023-08-07 ENCOUNTER — Other Ambulatory Visit: Payer: Self-pay

## 2023-08-07 MED ORDER — FERROUS GLUCONATE 324 (38 FE) MG PO TABS: 324.0000 mg | ORAL_TABLET | Freq: Every day | ORAL | 12 refills | Status: AC

## 2023-08-08 ENCOUNTER — Telehealth: Payer: Self-pay

## 2023-08-08 DIAGNOSIS — K529 Noninfective gastroenteritis and colitis, unspecified: Secondary | ICD-10-CM

## 2023-08-08 DIAGNOSIS — K56699 Other intestinal obstruction unspecified as to partial versus complete obstruction: Secondary | ICD-10-CM

## 2023-08-08 DIAGNOSIS — Z9225 Personal history of immunosupression therapy: Secondary | ICD-10-CM

## 2023-08-08 DIAGNOSIS — K51019 Ulcerative (chronic) pancolitis with unspecified complications: Secondary | ICD-10-CM

## 2023-08-08 NOTE — Telephone Encounter (Signed)
 Order placed for CT Enterography.  Called and scheduled patient for [facility] Fri 6-27 at 3:00 to Arrive at 2pm.  Per other message from Dr. Brice Campi - changing colon to EGD/Colon 8-21 at [facility]. Called Endo unit and they made the change.

## 2023-08-08 NOTE — Telephone Encounter (Signed)
 Sent MyChart message to patient with CT Enterography appointment information

## 2023-08-08 NOTE — Telephone Encounter (Signed)
-----   Message from [facility] sent at 08/07/2023  4:21 PM EDT ----- Regarding: Followup Maya, After continuing to think through the patient's history, I would like him to undergo a CT enterography before his colonoscopy as well. We had actually discussed this at the clinic visit. This will help guide us  as we prepare for his colonoscopy which had previous stricturing disease. Thanks. GM

## 2023-08-08 NOTE — Telephone Encounter (Signed)
 Called and left message for patient that we have changed his colon to [facility] and gave him the appointment information for the CT enterography on 6-27. Gave patient the number to call radiology at [phone] if he wants to reschedule if this is not convenient.

## 2023-08-08 NOTE — Telephone Encounter (Signed)
-----   Message from [facility] sent at 08/07/2023  4:07 PM EDT ----- Regarding: Follow-up Isaiah Carpenter, Please add on an upper endoscopy to be performed at the time of his colonoscopy.  Let him know that as I reviewed our previous documents, we found that he had persisting esophagitis.  I need to ensure that that is healed and there is no Barrett's.  That is why we are adding the upper endoscopy to be done at the same time. Thanks. GM

## 2023-08-08 NOTE — Telephone Encounter (Signed)
 Called Endo unit and changed to [facility] on 8-21

## 2023-08-09 LAB — QUANTIFERON-TB GOLD PLUS
Mitogen-NIL: >10 [IU]/mL
NIL: 0.07 [IU]/mL
QuantiFERON-TB Gold Plus: NEGATIVE
TB1-NIL: 0 [IU]/mL
TB2-NIL: 0.01 [IU]/mL

## 2023-08-16 ENCOUNTER — Telehealth: Payer: Self-pay

## 2023-08-16 NOTE — Telephone Encounter (Signed)
 The pt is taking Humira  he states that when the last shipment came it was enough to last until this upcoming injection.

## 2023-08-16 NOTE — Telephone Encounter (Signed)
 Pharmacy Patient Advocate Encounter   Received notification from Patient Insurance that prior authorization for Humira  40mg /0.82mLis required/requested.   Insurance verification completed.   The patient is insured through [employer] .   Per test claim:  Please clarify if patient still taking this medication.  Last refill for Humira  was 11-29-2022 then changed to bio-similar, Simlandi with only one fill on 12-30-2022. Office notes from most recent visit states patient on Humira .

## 2023-08-16 NOTE — Telephone Encounter (Signed)
 Are you able to confirm where patient is getting fills from? I was able to have someone pull up his fill record and the last fill was indeed in October 2024 at [facility], but it was only for a one month supply. I just want to make sure that there is record of patient taking med so that we do not have to worry about redoing labs for levels or possibly restarting medication.

## 2023-08-16 NOTE — Telephone Encounter (Signed)
 He told me [facility] and that they sent him an excess of medication and he has and is still taking.

## 2023-08-17 ENCOUNTER — Telehealth: Payer: Self-pay | Admitting: Pharmacist

## 2023-08-17 NOTE — Telephone Encounter (Signed)
 Patient is still on every 2 week Humira . Please update this. GM

## 2023-08-17 NOTE — Telephone Encounter (Signed)
 According to [facility] records, the most recent Humira  prescription filled was on 12/06/2022 for 2 pens per 28 days. On 08/04/2023, a new prescription was sent from the office for 4 pens per 28 days (indicating once-weekly dosing). However, I could not find documentation in the chart to support this change in dosing.  The patient last had labs completed in April 2023, following the initiation of Humira  in January 2023. At the most recent office visit on 08/04/2023, the note indicates continuation of every-other-week dosing, which contradicts the new prescription. There are also notes recommending a colonoscopy, but no appointment is currently scheduled in the chart. A CT is planned for 06/27, and labs appear to have been intended as well, based on a note instructing the patient to go to the lab in the basement at the end of the 08/04/2023 appointment--but there is no confirmation that labs were collected. I do see the colonoscopy scheduled for 10/12/23.  While notes state patient is doing well on Humira , based on the formation I have found, I am concerned the patient is either not taking the medication at all or has not been taking as prescribed. I recommend checking lab values to gain a clearer picture of the patient's response to therapy and adherence.  Please advise.  Thank you, Devere Pandy, PharmD Clinical Pharmacist  [place]  Direct Dial: [phone]

## 2023-08-18 ENCOUNTER — Ambulatory Visit (HOSPITAL_COMMUNITY)
Admission: RE | Admit: 2023-08-18 | Discharge: 2023-08-18 | Disposition: A | Discharge status: Home / Self Care | Source: Ambulatory Visit | Attending: Gastroenterology | Admitting: Gastroenterology

## 2023-08-18 DIAGNOSIS — Z9225 Personal history of immunosupression therapy: Secondary | ICD-10-CM | POA: Diagnosis present

## 2023-08-18 DIAGNOSIS — K51019 Ulcerative (chronic) pancolitis with unspecified complications: Secondary | ICD-10-CM | POA: Insufficient documentation

## 2023-08-18 DIAGNOSIS — K56699 Other intestinal obstruction unspecified as to partial versus complete obstruction: Secondary | ICD-10-CM | POA: Insufficient documentation

## 2023-08-18 DIAGNOSIS — K529 Noninfective gastroenteritis and colitis, unspecified: Secondary | ICD-10-CM | POA: Insufficient documentation

## 2023-08-18 MED ORDER — IOHEXOL 300 MG/ML  SOLN
100.0000 mL | Freq: Once | INTRAMUSCULAR | Status: AC | PRN
  Administered 2023-08-18: 100 mL via INTRAVENOUS

## 2023-08-18 NOTE — Telephone Encounter (Signed)
 PA form was received, however it is marked for the incorrect dosing. I will update and submit. Please make sure correct script is sent to pharmacy.

## 2023-08-18 NOTE — Telephone Encounter (Signed)
 [facility] pharmacy calling to verify if we received paperwork in regards to humira ? Please advise.

## 2023-08-18 NOTE — Telephone Encounter (Signed)
 See message Rosina

## 2023-08-21 ENCOUNTER — Other Ambulatory Visit (HOSPITAL_COMMUNITY): Payer: Self-pay

## 2023-08-21 ENCOUNTER — Telehealth: Payer: Self-pay

## 2023-08-21 NOTE — Telephone Encounter (Signed)
 Pharmacy Patient Advocate Encounter  Received notification from [employer] that Prior Authorization for  Humira  Pen (CF) 40MG /0.4ML pen-injector kit has been DENIED.  Full denial letter will be uploaded to the media tab. See denial reason below.  The requested drug is NON-PREFERRED and coverage is not authorized under the plan. PREFERRED products include Cyltezo/adalimumab -adbm, adalimumab -adaz, and Simlandi/adalimumab -ryvk.  The non-preferred drug is covered when documentation is provided showing that the patient has tried ALL of the following: Cyltezo/adalimumab -adbm, adalimumab -adaz, and Simlandi/adalimumab -ryvk and the patient cannot continue to use ALL preferred medications due to formulation differences in the inactive ingredient(s) [for example, differences in stabilizing agent, buffering agent, and/or surfactant] which, according to the prescriber, would result in a significant allergy or serious adverse reaction. Coverage for the requested product cannot be authorized at this time.   PA #/Case ID/Reference #: [id number]

## 2023-08-21 NOTE — Telephone Encounter (Signed)
 Pharmacy Patient Advocate Encounter   Received notification from Pt Calls Messages that prior authorization for Humira  Pen (CF) 40MG /0.4ML pen-injector kit is required/requested.   Insurance verification completed.   The patient is insured through [employer] .   Per test claim: PA required; PA submitted to above mentioned insurance via CoverMyMeds Key/confirmation #/EOC [id number] Status is pending

## 2023-08-23 ENCOUNTER — Encounter: Payer: Self-pay | Admitting: Gastroenterology

## 2023-08-28 ENCOUNTER — Other Ambulatory Visit (HOSPITAL_COMMUNITY): Payer: Self-pay

## 2023-08-28 MED ORDER — HUMIRA (2 PEN) 40 MG/0.4ML ~~LOC~~ AJKT: AUTO-INJECTOR | SUBCUTANEOUS | 3 refills | Status: AC

## 2023-08-28 NOTE — Addendum Note (Signed)
 Addended by: ANITRA ODETTA CROME on: 08/28/2023 08:43 AM   Modules accepted: Orders

## 2023-08-29 ENCOUNTER — Other Ambulatory Visit: Payer: Self-pay

## 2023-08-29 MED ORDER — CYLTEZO (2 PEN) 40 MG/0.4ML ~~LOC~~ AJKT: 40.0000 mg | AUTO-INJECTOR | SUBCUTANEOUS | 3 refills | Status: DC

## 2023-08-29 NOTE — Telephone Encounter (Signed)
See alternate MyChart message to pt.

## 2023-08-29 NOTE — Telephone Encounter (Signed)
 Please let patient know that we are having issues with his current insurance. Let him know that they are asking us  to use a bio similar, which should have equal efficacy to Humira . I am okay with patient being transitioned to either Cyltezo  or Simlandi. If he is agreeable to this, then lets move forward with prescription being sent. Thanks. GM

## 2023-08-31 ENCOUNTER — Other Ambulatory Visit: Payer: Self-pay

## 2023-08-31 ENCOUNTER — Ambulatory Visit: Payer: Self-pay | Admitting: Gastroenterology

## 2023-08-31 DIAGNOSIS — R932 Abnormal findings on diagnostic imaging of liver and biliary tract: Secondary | ICD-10-CM

## 2023-09-05 ENCOUNTER — Telehealth: Payer: Self-pay

## 2023-09-05 ENCOUNTER — Other Ambulatory Visit (HOSPITAL_COMMUNITY): Payer: Self-pay

## 2023-09-05 NOTE — Telephone Encounter (Signed)
 Pharmacy Patient Advocate Encounter   Received notification from Patient Insurance that prior authorization for Adalimumab -adbm (CYLTEZO , 2 PEN,) 40 MG/0.4ML AJKT is required/requested.   Insurance verification completed.   The patient is insured through [employer] .   Per test claim: PA required; PA submitted to above mentioned insurance via Fax Key/confirmation #/EOC [employer] [phone]  Status is pending

## 2023-09-12 ENCOUNTER — Other Ambulatory Visit (HOSPITAL_COMMUNITY): Payer: Self-pay

## 2023-09-12 NOTE — Telephone Encounter (Signed)
 Pharmacy Patient Advocate Encounter  Contacted patient insurance, [employer] about status of prior authorization.   Prior Authorization for Adalimumab -adbm (CYLTEZO , 2 PEN,) 40 MG/0.4ML AJKT has been APPROVED from 08-06-2023 to 09-04-2024. Co-pay for 1 month supply, 4 pens, is $95.00  Paid claim shown at [facility] on 09-08-2023

## 2023-09-12 NOTE — Telephone Encounter (Signed)
 See MyChart message

## 2023-09-29 ENCOUNTER — Encounter: Payer: Self-pay | Admitting: Gastroenterology

## 2023-09-30 ENCOUNTER — Ambulatory Visit (HOSPITAL_COMMUNITY)
Admission: RE | Admit: 2023-09-30 | Discharge: 2023-09-30 | Disposition: A | Discharge status: Home / Self Care | Source: Ambulatory Visit | Attending: Gastroenterology | Admitting: Gastroenterology

## 2023-09-30 DIAGNOSIS — R932 Abnormal findings on diagnostic imaging of liver and biliary tract: Secondary | ICD-10-CM | POA: Insufficient documentation

## 2023-10-01 ENCOUNTER — Ambulatory Visit: Payer: Self-pay | Admitting: Gastroenterology

## 2023-10-03 ENCOUNTER — Encounter (HOSPITAL_COMMUNITY): Payer: Self-pay | Admitting: Gastroenterology

## 2023-10-03 NOTE — Progress Notes (Signed)
 Attempted to obtain medical history for pre op call via telephone, unable to reach at this time. HIPAA compliant voicemail message left requesting return call to pre surgical testing department.

## 2023-10-04 ENCOUNTER — Telehealth: Payer: Self-pay | Admitting: Gastroenterology

## 2023-10-04 MED ORDER — NA SULFATE-K SULFATE-MG SULF 17.5-3.13-1.6 GM/177ML PO SOLN: 1.0000 | Freq: Once | ORAL | 0 refills | Status: AC

## 2023-10-04 NOTE — Telephone Encounter (Signed)
 Procedure:Colonoscopy/Endoscopy Procedure date: 10/12/23 Procedure location: [facility] Arrival Time: 10:15 am Spoke with the patient Y/N: Yes Any prep concerns? No  Has the patient obtained the prep from the pharmacy ? Pt stated he haven't received a reminder from [facility], I told pt if the prep is not at the pharmacy when he goes tomorrow to call us  back so it could be sent in. Do you have a care partner and transportation: Yes Any additional concerns? No

## 2023-10-11 NOTE — Anesthesia Preprocedure Evaluation (Signed)
 Anesthesia Evaluation  Patient identified by MRN, date of birth, ID band Patient awake    Reviewed: Allergy & Precautions, NPO status , Patient's Chart, lab work & pertinent test results  History of Anesthesia Complications Negative for: history of anesthetic complications  Airway Mallampati: I  TM Distance: >3 FB Neck ROM: Full    Dental no notable dental hx.    Pulmonary Patient abstained from smoking., former smoker   Pulmonary exam normal        Cardiovascular negative cardio ROS Normal cardiovascular exam     Neuro/Psych negative neurological ROS  negative psych ROS   GI/Hepatic Neg liver ROS, PUD,GERD  ,,Crohn's disease   Endo/Other  negative endocrine ROS    Renal/GU negative Renal ROS  negative genitourinary   Musculoskeletal negative musculoskeletal ROS (+)    Abdominal   Peds  Hematology negative hematology ROS (+)   Anesthesia Other Findings Day of surgery medications reviewed with patient.  Reproductive/Obstetrics negative OB ROS                              Anesthesia Physical Anesthesia Plan  ASA: 2  Anesthesia Plan: MAC   Post-op Pain Management: Minimal or no pain anticipated   Induction:   PONV Risk Score and Plan: 1 and Treatment may vary due to age or medical condition and Propofol  infusion  Airway Management Planned: Natural Airway and Simple Face Mask  Additional Equipment: None  Intra-op Plan:   Post-operative Plan:   Informed Consent: I have reviewed the patients History and Physical, chart, labs and discussed the procedure including the risks, benefits and alternatives for the proposed anesthesia with the patient or authorized representative who has indicated his/her understanding and acceptance.       Plan Discussed with: CRNA  Anesthesia Plan Comments:          Anesthesia Quick Evaluation

## 2023-10-12 ENCOUNTER — Ambulatory Visit (HOSPITAL_COMMUNITY): Payer: Self-pay | Admitting: Anesthesiology

## 2023-10-12 ENCOUNTER — Other Ambulatory Visit: Payer: Self-pay

## 2023-10-12 ENCOUNTER — Encounter (HOSPITAL_COMMUNITY)
Admission: EM | Disposition: A | Discharge status: Home / Self Care | Payer: Self-pay | Source: Home / Self Care | Attending: Gastroenterology

## 2023-10-12 ENCOUNTER — Ambulatory Visit (HOSPITAL_COMMUNITY)
Admission: EM | Admit: 2023-10-12 | Discharge: 2023-10-12 | Disposition: A | Discharge status: Home / Self Care | Attending: Gastroenterology | Admitting: Gastroenterology

## 2023-10-12 ENCOUNTER — Encounter (HOSPITAL_COMMUNITY): Payer: Self-pay | Admitting: Gastroenterology

## 2023-10-12 DIAGNOSIS — K529 Noninfective gastroenteritis and colitis, unspecified: Secondary | ICD-10-CM | POA: Diagnosis not present

## 2023-10-12 DIAGNOSIS — K644 Residual hemorrhoidal skin tags: Secondary | ICD-10-CM | POA: Diagnosis not present

## 2023-10-12 DIAGNOSIS — K6389 Other specified diseases of intestine: Secondary | ICD-10-CM

## 2023-10-12 DIAGNOSIS — K51512 Left sided colitis with intestinal obstruction: Secondary | ICD-10-CM

## 2023-10-12 DIAGNOSIS — K3189 Other diseases of stomach and duodenum: Secondary | ICD-10-CM

## 2023-10-12 DIAGNOSIS — K219 Gastro-esophageal reflux disease without esophagitis: Secondary | ICD-10-CM | POA: Insufficient documentation

## 2023-10-12 DIAGNOSIS — Z862 Personal history of diseases of the blood and blood-forming organs and certain disorders involving the immune mechanism: Secondary | ICD-10-CM

## 2023-10-12 DIAGNOSIS — F17290 Nicotine dependence, other tobacco product, uncomplicated: Secondary | ICD-10-CM | POA: Diagnosis not present

## 2023-10-12 DIAGNOSIS — K641 Second degree hemorrhoids: Secondary | ICD-10-CM | POA: Diagnosis not present

## 2023-10-12 DIAGNOSIS — K6289 Other specified diseases of anus and rectum: Secondary | ICD-10-CM

## 2023-10-12 DIAGNOSIS — E611 Iron deficiency: Secondary | ICD-10-CM

## 2023-10-12 DIAGNOSIS — K2980 Duodenitis without bleeding: Secondary | ICD-10-CM | POA: Diagnosis not present

## 2023-10-12 DIAGNOSIS — K624 Stenosis of anus and rectum: Secondary | ICD-10-CM

## 2023-10-12 DIAGNOSIS — D509 Iron deficiency anemia, unspecified: Secondary | ICD-10-CM | POA: Diagnosis not present

## 2023-10-12 DIAGNOSIS — K2289 Other specified disease of esophagus: Secondary | ICD-10-CM | POA: Diagnosis not present

## 2023-10-12 DIAGNOSIS — K449 Diaphragmatic hernia without obstruction or gangrene: Secondary | ICD-10-CM | POA: Insufficient documentation

## 2023-10-12 DIAGNOSIS — K56699 Other intestinal obstruction unspecified as to partial versus complete obstruction: Secondary | ICD-10-CM

## 2023-10-12 HISTORY — PX: ESOPHAGOGASTRODUODENOSCOPY: SHX5428

## 2023-10-12 HISTORY — PX: COLONOSCOPY: SHX5424

## 2023-10-12 SURGERY — COLONOSCOPY
Anesthesia: Monitor Anesthesia Care

## 2023-10-12 MED ORDER — HYOSCYAMINE SULFATE 0.125 MG SL SUBL
0.2500 mg | SUBLINGUAL_TABLET | Freq: Once | SUBLINGUAL | Status: AC
  Administered 2023-10-12: 0.25 mg via SUBLINGUAL
  Filled 2023-10-12: qty 2

## 2023-10-12 MED ORDER — PROPOFOL 500 MG/50ML IV EMUL
INTRAVENOUS | Status: DC | PRN
  Administered 2023-10-12: 175 ug/kg/min via INTRAVENOUS

## 2023-10-12 MED ORDER — SODIUM CHLORIDE 0.9 % IV SOLN: INTRAVENOUS | Status: DC

## 2023-10-12 NOTE — H&P (Signed)
 GASTROENTEROLOGY PROCEDURE H&P NOTE   Primary Care Physician: Pcp, No  HPI: Isaiah Carpenter is a 28 y.o. male who presents for EGD/colonoscopy for history of iron deficiency, esophagitis, ulcerative colitis versus Crohn's disease with prior colonic stricturing for assessment of disease activity on Humira  biosimilar.  Past Medical History:  Diagnosis Date   Anemia    Past Surgical History:  Procedure Laterality Date   BIOPSY  08/04/2020   Procedure: BIOPSY;  Surgeon: Wilhelmenia Aloha Raddle., MD;  Location: [facility] ENDOSCOPY;  Service: Gastroenterology;;   COLONOSCOPY     COLONOSCOPY WITH PROPOFOL  N/A 08/04/2020   Procedure: COLONOSCOPY;  Surgeon: Wilhelmenia Aloha Raddle., MD;  Location: [facility] ENDOSCOPY;  Service: Gastroenterology;  Laterality: N/A;   ESOPHAGOGASTRODUODENOSCOPY N/A 08/04/2020   Procedure: ESOPHAGOGASTRODUODENOSCOPY (EGD);  Surgeon: Wilhelmenia Aloha Raddle., MD;  Location: [facility] ENDOSCOPY;  Service: Gastroenterology;  Laterality: N/A;   UPPER GASTROINTESTINAL ENDOSCOPY     Current Facility-Administered Medications  Medication Dose Route Frequency Provider Last Rate Last Admin   0.9 %  sodium chloride  infusion  500 mL Intravenous Once Mansouraty, Avalyn Molino Jr., MD       0.9 %  sodium chloride  infusion   Intravenous Continuous Mansouraty, Anvi Mangal Jr., MD       Current Outpatient Medications  Medication Sig Dispense Refill   acetaminophen  (TYLENOL ) 500 MG tablet Take 500-1,000 mg by mouth every 8 (eight) hours as needed for mild pain (or headaches).     ferrous gluconate  (FERGON) 324 MG tablet Take 1 tablet (324 mg total) by mouth daily with breakfast. 30 tablet 12   adalimumab  (HUMIRA , 2 PEN,) 40 MG/0.4ML pen INJECT 40 MG UNDER THE SKIN EVERY 7 DAYS. 4 each 3   Adalimumab -adbm (CYLTEZO , 2 PEN,) 40 MG/0.4ML AJKT Inject 40 mg into the skin every 7 (seven) days. 2 each 3    Current Facility-Administered Medications:    0.9 %  sodium chloride  infusion, 500 mL, Intravenous, Once,  Mansouraty, Nellene Courtois Jr., MD   0.9 %  sodium chloride  infusion, , Intravenous, Continuous, Mansouraty, Aloha Raddle., MD  Current Outpatient Medications:    acetaminophen  (TYLENOL ) 500 MG tablet, Take 500-1,000 mg by mouth every 8 (eight) hours as needed for mild pain (or headaches)., Disp: , Rfl:    ferrous gluconate  (FERGON) 324 MG tablet, Take 1 tablet (324 mg total) by mouth daily with breakfast., Disp: 30 tablet, Rfl: 12   adalimumab  (HUMIRA , 2 PEN,) 40 MG/0.4ML pen, INJECT 40 MG UNDER THE SKIN EVERY 7 DAYS., Disp: 4 each, Rfl: 3   Adalimumab -adbm (CYLTEZO , 2 PEN,) 40 MG/0.4ML AJKT, Inject 40 mg into the skin every 7 (seven) days., Disp: 2 each, Rfl: 3 Allergies  Allergen Reactions   Aleve [Naproxen Sodium] Anaphylaxis   Feraheme  [Ferumoxytol ] Shortness Of Breath    Patient had hot flushes and tight ness in ches within 5 minutes of iv infusion   Ibuprofen Anaphylaxis and Swelling   Family History  Problem Relation Age of Onset   Colon cancer Neg Hx    Esophageal cancer Neg Hx    Stomach cancer Neg Hx    Inflammatory bowel disease Neg Hx    Liver disease Neg Hx    Pancreatic cancer Neg Hx    Rectal cancer Neg Hx    Social History   Socioeconomic History   Marital status: Single    Spouse name: Not on file   Number of children: Not on file   Years of education: Not on file   Highest education level: Not on file  Occupational History   Not on file  Tobacco Use   Smoking status: Former    Current packs/day: 0.50    Types: Cigarettes   Smokeless tobacco: Never  Vaping Use   Vaping status: Some Days  Substance and Sexual Activity   Alcohol use: Yes    Comment: rarely   Drug use: Never   Sexual activity: Yes  Other Topics Concern   Not on file  Social History Narrative   Not on file   Social Drivers of Health   Financial Resource Strain: Not on file  Food Insecurity: Not on file  Transportation Needs: Not on file  Physical Activity: Not on file  Stress: Not on  file  Social Connections: Unknown (07/06/2021)   Received from [facility]   Social Network    Social Network: Not on file  Intimate Partner Violence: Unknown (05/28/2021)   Received from [facility]   HITS    Physically Hurt: Not on file    Insult or Talk Down To: Not on file    Threaten Physical Harm: Not on file    Scream or Curse: Not on file    Physical Exam: Today's Vitals   10/12/23 1130 10/12/23 1135 10/12/23 1145 10/12/23 1150  BP: 98/63  119/81   Pulse: (!) 53 (!) 55 64 (!) 51  Resp: 18 17 16 13   Temp:   (!) 97.4 F (36.3 C)   TempSrc:   Temporal   SpO2: 100% 100% 100% 100%  Weight:      Height:      PainSc:   0-No pain    Body mass index is 26.63 kg/m. GEN: NAD EYE: Sclerae anicteric ENT: MMM CV: Non-tachycardic GI: Soft, NT/ND NEURO:  Alert & Oriented x 3  Lab Results: No results for input(s): WBC, HGB, HCT, PLT in the last 72 hours. BMET No results for input(s): NA, K, CL, CO2, GLUCOSE, BUN, CREATININE, CALCIUM in the last 72 hours. LFT No results for input(s): PROT, ALBUMIN, AST, ALT, ALKPHOS, BILITOT, BILIDIR, IBILI in the last 72 hours. PT/INR No results for input(s): LABPROT, INR in the last 72 hours.   Impression / Plan: This is a 28 y.o.male who presents for EGD/colonoscopy for history of iron deficiency, esophagitis, ulcerative colitis versus Crohn's disease with prior colonic stricturing for assessment of disease activity on Humira  biosimilar.  The risks and benefits of endoscopic evaluation/treatment were discussed with the patient and/or family; these include but are not limited to the risk of perforation, infection, bleeding, missed lesions, lack of diagnosis, severe illness requiring hospitalization, as well as anesthesia and sedation related illnesses.  The patient's history has been reviewed, patient examined, no change in status, and deemed stable for procedure.  The patient and/or family is  agreeable to proceed.    Aloha Finner, MD [facility] Advanced [facility]oscopy Office # [phone]

## 2023-10-12 NOTE — Discharge Instructions (Signed)

## 2023-10-12 NOTE — Anesthesia Postprocedure Evaluation (Signed)
 Anesthesia Post Note  Patient: Isaiah Carpenter  Procedure(s) Performed: COLONOSCOPY EGD (ESOPHAGOGASTRODUODENOSCOPY)     Patient location during evaluation: PACU Anesthesia Type: MAC Level of consciousness: awake and alert Pain management: pain level controlled Vital Signs Assessment: post-procedure vital signs reviewed and stable Respiratory status: spontaneous breathing, nonlabored ventilation and respiratory function stable Cardiovascular status: blood pressure returned to baseline Postop Assessment: no apparent nausea or vomiting Anesthetic complications: no   No notable events documented.  Last Vitals:  Vitals:   10/12/23 1125 10/12/23 1130  BP:  98/63  Pulse: 62 (!) 53  Resp: (!) 21 18  Temp:    SpO2: 99% 100%    Last Pain:  Vitals:   10/12/23 1117  TempSrc:   PainSc: 0-No pain                 Vertell Row

## 2023-10-12 NOTE — Op Note (Signed)
 [facility] Patient Name: Isaiah Carpenter Procedure Date: 10/12/2023 MRN: [id number] Attending MD: Aloha Finner , MD, [id number] Date of Birth: September 18, 1995 CSN: [id number] Age: 28 Admit Type: Outpatient Procedure:                Colonoscopy Indications:              Generalized abdominal distress, Disease activity                            assessment of left-sided chronic ulcerative                            colitis, Assess therapeutic response to therapy of                            left-sided chronic ulcerative colitis Providers:                Aloha Finner, MD, Ozell Pouch, Lorrayne Kitty, Technician Referring MD:             Aloha Finner, MD Medicines:                Monitored Anesthesia Care Complications:            No immediate complications. Estimated Blood Loss:     Estimated blood loss was minimal. Procedure:                Pre-Anesthesia Assessment:                           - Prior to the procedure, a History and Physical                            was performed, and patient medications and                            allergies were reviewed. The patient's tolerance of                            previous anesthesia was also reviewed. The risks                            and benefits of the procedure and the sedation                            options and risks were discussed with the patient.                            All questions were answered, and informed consent                            was obtained. Prior Anticoagulants: The patient has                            taken no anticoagulant  or antiplatelet agents. ASA                            Grade Assessment: II - A patient with mild systemic                            disease. After reviewing the risks and benefits,                            the patient was deemed in satisfactory condition to                            undergo the procedure.                            After obtaining informed consent, the colonoscope                            was passed under direct vision. Throughout the                            procedure, the patient's blood pressure, pulse, and                            oxygen saturations were monitored continuously. The                            PCF-H190TL ([id number]) Olympus Colonoscope was                            introduced through the anus and advanced to the the                            rectum to examine a stenosis. This was the intended                            extent. The GIF-[id number] ([id number]) Olympus endoscope                            was introduced through the anus and advanced to the                            the transverse colon to examine a stricture. This                            was the intended extent. The colonoscopy was                            performed without difficulty. The patient tolerated                            the procedure. The quality of the bowel preparation  was inadequate. Scope In: 10:41:34 AM Scope Out: 11:06:15 AM Scope Withdrawal Time: 0 hours 12 minutes 13 seconds  Total Procedure Duration: 0 hours 24 minutes 41 seconds  Findings:      Skin tags were found on perianal exam.      The digital rectal exam findings include hemorrhoids and a rectal       stenosis/stricture.      A severe stenosis measuring 2 cm (in length) x 8 mm (inner diameter) was       found in the mid rectum and was traversed with gentle pressure with the       ultraslim colonoscope. This led to evidence of inflammation as well as a       mucosal wrent. Biopsies were taken with a cold forceps for histology to       rule out active/chronic colitis changes.      Subsequently, a severe stenosis measuring 6 cm (in length) x 5 mm (inner       diameter) was found in the recto-sigmoid colon and was traversed only       after transitioning from an ultraslim colonoscope to the  ultraslim       neonatal endoscope. With gentle pressure, I was able to open up this       area which showed evidence of a mucosal wrent and what was suggestive of       active colitis. Biopsies were taken with a cold forceps for histology.      The lumen of the sigmoid colon, descending colon and transverse colon       was significantly dilated. Due to the amount of stool that was present,       true surveillance/screening would not be possible. However the mucosa       did not appear to be in active inflammation pattern at this time.       Biopsies were taken with a cold forceps for histology.      Non-bleeding non-thrombosed external and internal hemorrhoids were found       during retroflexion, during perianal exam and during digital exam. The       hemorrhoids were Grade II (internal hemorrhoids that prolapse but reduce       spontaneously). Impression:               - Preparation of the colon was inadequate.                           - Perianal skin tags found on perianal exam. Rectal                            stricture (mid rectum) and hemorrhoids found on                            digital rectal exam.                           - Stricture in the mid rectum. Traversed with                            gentle pressure with 10.4 mm ultraslim colonoscope.  Biopsied.                           - Stricture in the recto-sigmoid colon. Traversed                            only with transition to neonatal endoscope (5.2                            mm). Biopsied.                           - Dilated in the sigmoid colon, in the descending                            colon and in the transverse colon. Overt signs of                            inflammation were not present. Biopsied.                           - Non-bleeding non-thrombosed external and internal                            hemorrhoids. Moderate Sedation:      Not Applicable - Patient had care per  Anesthesia. Recommendation:           - The patient will be observed post-procedure,                            until all discharge criteria are met.                           - Discharge patient to home.                           - Patient has a contact number available for                            emergencies. The signs and symptoms of potential                            delayed complications were discussed with the                            patient. Return to normal activities tomorrow.                            Written discharge instructions were provided to the                            patient.                           - Low fiber diet.                           -  Continue present medications.                           - Await pathology results.                           - Repeat colonoscopy for surveillance.                           - Within the next 1 to 2 weeks, patient should                            bring in fecal calprotectin to see if this                            correlates with potential disease activity. His                            previous ESR/CRP are normal.                           - Will discuss with patient further need for                            evaluation with colorectal surgery based on                            pathology findings. The endoscopic findings at this                            point are suggestive of significant stricturing                            left-sided UC versus Crohn's disease (though overt                            findings of the sigmoid/descending/transverse colon                            did not appear to have active colitis (pathology is                            pending at this time).                           - Suspect patient's bloating symptom is a result of                            the stenoses that are present which are not                            amenable to endoscopic therapies especially if                             active disease is  present. Will need to consider                            other advanced therapies, should surgical options                            not be possible.                           - The findings and recommendations were discussed                            with the patient.                           - The findings and recommendations were discussed                            with the designated responsible adult. Procedure Code(s):        --- Professional ---                           [phone], 52, Colonoscopy, flexible; with biopsy,                            single or multiple Diagnosis Code(s):        --- Professional ---                           K64.1, Second degree hemorrhoids                           K51.512, Left sided colitis with intestinal                            obstruction                           K62.4, Stenosis of anus and rectum                           K59.39, Other megacolon                           K64.4, Residual hemorrhoidal skin tags                           R10.84, Generalized abdominal pain CPT copyright 2022 American Medical Association. All rights reserved. The codes documented in this report are preliminary and upon coder review may  be revised to meet current compliance requirements. Aloha Finner, MD 10/12/2023 11:42:07 AM Number of Addenda: 0

## 2023-10-12 NOTE — Transfer of Care (Signed)
 Immediate Anesthesia Transfer of Care Note  Patient: Isaiah Carpenter  Procedure(s) Performed: COLONOSCOPY EGD (ESOPHAGOGASTRODUODENOSCOPY)  Patient Location: PACU  Anesthesia Type:MAC  Level of Consciousness: sedated, patient cooperative, and responds to stimulation  Airway & Oxygen Therapy: Patient Spontanous Breathing and Patient connected to face mask oxygen  Post-op Assessment: Report given to RN and Post -op Vital signs reviewed and stable  Post vital signs: Reviewed and stable  Last Vitals:  Vitals Value Taken Time  BP 107/77 10/12/23 11:16  Temp    Pulse 68 10/12/23 11:17  Resp 20 10/12/23 11:17  SpO2 99 % 10/12/23 11:17  Vitals shown include unfiled device data.  Last Pain:  Vitals:   10/12/23 0948  TempSrc: Temporal  PainSc: 0-No pain         Complications: No notable events documented.

## 2023-10-12 NOTE — Op Note (Signed)
 [facility] Patient Name: Isaiah Carpenter Procedure Date: 10/12/2023 MRN: [id number] Attending MD: Aloha Finner , MD, [id number] Date of Birth: Oct 11, 1995 CSN: [id number] Age: 28 Admit Type: Outpatient Procedure:                Upper GI endoscopy Indications:              Iron deficiency anemia Providers:                Aloha Finner, MD, Ozell Pouch, Lorrayne Kitty, Technician Referring MD:             Aloha Finner, MD Medicines:                Monitored Anesthesia Care Complications:            No immediate complications. Estimated Blood Loss:     Estimated blood loss was minimal. Procedure:                Pre-Anesthesia Assessment:                           - Prior to the procedure, a History and Physical                            was performed, and patient medications and                            allergies were reviewed. The patient's tolerance of                            previous anesthesia was also reviewed. The risks                            and benefits of the procedure and the sedation                            options and risks were discussed with the patient.                            All questions were answered, and informed consent                            was obtained. Prior Anticoagulants: The patient has                            taken no anticoagulant or antiplatelet agents. ASA                            Grade Assessment: II - A patient with mild systemic                            disease. After reviewing the risks and benefits,  the patient was deemed in satisfactory condition to                            undergo the procedure.                           After obtaining informed consent, the endoscope was                            passed under direct vision. Throughout the                            procedure, the patient's blood pressure, pulse, and                             oxygen saturations were monitored continuously. The                            GIF-[id number] ([id number]) Olympus endoscope was introduced                            through the mouth, and advanced to the second part                            of duodenum. The upper GI endoscopy was                            accomplished without difficulty. The patient                            tolerated the procedure. Scope In: Scope Out: Findings:      No gross lesions were noted in the proximal esophagus and in the mid       esophagus.      Scattered islands of salmon-colored mucosa were present from 34 to 35       cm. No other visible abnormalities were present. Biopsies were taken       with a cold forceps for histology.      A 2 cm hiatal hernia was present.      No gross lesions were noted in the entire examined stomach.      Patchy mildly erythematous mucosa without active bleeding was found in       the duodenal bulb, in the first portion of the duodenum and in the       second portion of the duodenum. Biopsies were taken with a cold forceps       for histology. Impression:               - No gross lesions in the proximal esophagus and in                            the mid esophagus.                           - Salmon-colored mucosal islands suspicious for  Barrett's esophagus found distally. Biopsied.                           - 2 cm hiatal hernia.                           - No gross lesions in the entire stomach.                           - Erythematous duodenopathy. Biopsied. Moderate Sedation:      Not Applicable - Patient had care per Anesthesia. Recommendation:           - Proceed to scheduled colonoscopy.                           - Continue present medications.                           - Await pathology results.                           - Repeat upper endoscopy for surveillance.                           - The findings and recommendations were  discussed                            with the patient.                           - The findings and recommendations were discussed                            with the designated responsible adult. Procedure Code(s):        --- Professional ---                           [phone], Esophagogastroduodenoscopy, flexible,                            transoral; with biopsy, single or multiple Diagnosis Code(s):        --- Professional ---                           K22.89, Other specified disease of esophagus                           K44.9, Diaphragmatic hernia without obstruction or                            gangrene                           K31.89, Other diseases of stomach and duodenum                           D50.9, Iron deficiency anemia, unspecified CPT copyright 2022 American Medical Association.  All rights reserved. The codes documented in this report are preliminary and upon coder review may  be revised to meet current compliance requirements. Aloha Finner, MD 10/12/2023 11:28:27 AM Number of Addenda: 0

## 2023-10-15 ENCOUNTER — Encounter (HOSPITAL_COMMUNITY): Payer: Self-pay | Admitting: Gastroenterology

## 2023-10-16 LAB — SURGICAL PATHOLOGY

## 2023-10-24 ENCOUNTER — Encounter: Payer: Self-pay | Admitting: Gastroenterology

## 2023-10-24 ENCOUNTER — Ambulatory Visit: Payer: Self-pay | Admitting: Gastroenterology

## 2023-10-24 NOTE — Progress Notes (Signed)
 The patient has been notified of this information and all questions answered.  [facility] referral made and records faxed  Appt made for 10/15 at 930 am with  GM

## 2023-11-13 ENCOUNTER — Encounter: Payer: Self-pay | Admitting: Surgery

## 2023-11-18 ENCOUNTER — Other Ambulatory Visit: Payer: Self-pay | Admitting: Gastroenterology

## 2023-11-20 ENCOUNTER — Other Ambulatory Visit (HOSPITAL_COMMUNITY): Payer: Self-pay

## 2023-11-29 ENCOUNTER — Encounter: Payer: Self-pay | Admitting: Gastroenterology

## 2023-12-01 ENCOUNTER — Ambulatory Visit: Admitting: Gastroenterology

## 2023-12-06 ENCOUNTER — Ambulatory Visit: Admitting: Gastroenterology

## 2024-03-04 ENCOUNTER — Encounter: Payer: Self-pay | Admitting: Gastroenterology

## 2024-03-14 ENCOUNTER — Encounter: Payer: Self-pay | Admitting: Gastroenterology

## 2024-03-20 NOTE — Telephone Encounter (Signed)
Ro have you seen this?

## 2024-03-26 ENCOUNTER — Encounter: Payer: Self-pay | Admitting: Gastroenterology

## 2024-03-27 ENCOUNTER — Encounter: Payer: Self-pay | Admitting: Gastroenterology

## 2024-04-23 ENCOUNTER — Ambulatory Visit: Admitting: Gastroenterology
